# Patient Record
Sex: Male | Born: 1970 | Race: Black or African American | Hispanic: No | Marital: Single | Smoking: Current every day smoker
Health system: Southern US, Community
[De-identification: ages and names within clinical notes are randomized; demographics above are authoritative.]

## PROBLEM LIST (undated history)

## (undated) ENCOUNTER — Emergency Department (HOSPITAL_COMMUNITY): Admission: EM | Payer: Self-pay

## (undated) HISTORY — PX: SURGERY SCROTAL / TESTICULAR: SUR1316

---

## 2014-06-20 ENCOUNTER — Encounter (HOSPITAL_COMMUNITY): Payer: Self-pay

## 2014-06-20 ENCOUNTER — Emergency Department (HOSPITAL_COMMUNITY)
Admission: EM | Admit: 2014-06-20 | Discharge: 2014-06-20 | Disposition: A | Discharge status: Home / Self Care | Payer: Self-pay | Attending: Emergency Medicine | Admitting: Emergency Medicine

## 2014-06-20 DIAGNOSIS — Z720 Tobacco use: Secondary | ICD-10-CM | POA: Insufficient documentation

## 2014-06-20 DIAGNOSIS — Z711 Person with feared health complaint in whom no diagnosis is made: Secondary | ICD-10-CM

## 2014-06-20 DIAGNOSIS — Z202 Contact with and (suspected) exposure to infections with a predominantly sexual mode of transmission: Secondary | ICD-10-CM | POA: Insufficient documentation

## 2014-06-20 DIAGNOSIS — R300 Dysuria: Secondary | ICD-10-CM | POA: Insufficient documentation

## 2014-06-20 MED ORDER — CEFTRIAXONE SODIUM 250 MG IJ SOLR
250.0000 mg | Freq: Once | INTRAMUSCULAR | Status: AC
  Administered 2014-06-20: 250 mg via INTRAMUSCULAR
  Filled 2014-06-20: qty 250

## 2014-06-20 MED ORDER — AZITHROMYCIN 250 MG PO TABS
1000.0000 mg | ORAL_TABLET | Freq: Once | ORAL | Status: AC
  Administered 2014-06-20: 1000 mg via ORAL
  Filled 2014-06-20: qty 4

## 2014-06-20 MED ORDER — LIDOCAINE HCL (PF) 1 % IJ SOLN
2.0000 mL | Freq: Once | INTRAMUSCULAR | Status: AC
  Administered 2014-06-20: 2 mL via INTRADERMAL
  Filled 2014-06-20: qty 5

## 2014-06-20 NOTE — ED Notes (Signed)
Pt. Reports wanting to be checked for an STD. States he is having "discomfort. Something is not right down there". Denies discharge, rash, swelling.

## 2014-06-20 NOTE — Discharge Instructions (Signed)
Refrain from sexual intercourse for 7 days. Be sure to have all partners tested and treated for STDs.  This may be performed by your primary care provider or the health department.  Practice safe sex by always using condoms.  ° °

## 2014-06-20 NOTE — ED Notes (Signed)
PA at the bedside.

## 2014-06-20 NOTE — ED Notes (Signed)
Pt A7Ox4, ambulatory at d/c with steady gait, NAD

## 2014-06-20 NOTE — ED Provider Notes (Signed)
CSN: [id number]     Arrival date & time 06/20/14  2044 History  This chart was scribed for Junius Finner, PA-C, working with Juliet Rude. Rubin Payor, MD by Chestine Spore, ED Scribe. The patient was seen in room TR11C/TR11C at 9:34 PM.    Chief Complaint  Patient presents with  . Exposure to STD      The history is provided by the patient. No language interpreter was used.    HPI Comments: Rodney [name] is a 44 y.o. male who presents to the Emergency Department complaining of exposure to STD onset 2 days. He states that he is having associated symptoms of abdominal pain. Pt rates his abdominal pain is 7/10. Pt has been treated for STDs in the past. He states that he has not tried any medications for the relief of his symptoms. He denies penile discharge, rash, penile swelling, dysuria, and any other symptoms. Denies allergies to medications. Pt has concern for syphilis but not HIV.   History reviewed. No pertinent past medical history. History reviewed. No pertinent past surgical history. No family history on file. History  Substance Use Topics  . Smoking status: Current Every Day Smoker    Types: Cigarettes  . Smokeless tobacco: Not on file  . Alcohol Use: No    Review of Systems  Gastrointestinal: Positive for abdominal pain.  Genitourinary: Negative for discharge and penile swelling.      Allergies  Review of patient's allergies indicates no known allergies.  Home Medications   Prior to Admission medications   Not on File   BP 117/62 mmHg  Pulse 52  Temp(Src) 97.7 F (36.5 C) (Oral)  Resp 14  SpO2 100% Physical Exam  Constitutional: He is oriented to person, place, and time. He appears well-developed and well-nourished.  HENT:  Head: Normocephalic and atraumatic.  Eyes: EOM are normal.  Neck: Normal range of motion.  Cardiovascular: Normal rate.   Pulmonary/Chest: Effort normal.  Genitourinary: Testes normal and penis normal. Right testis shows no mass and no  tenderness. Left testis shows no mass and no tenderness. Uncircumcised. No penile erythema. No discharge found.  chaperoned exam.   Musculoskeletal: Normal range of motion.  Neurological: He is alert and oriented to person, place, and time.  Skin: Skin is warm and dry.  Psychiatric: He has a normal mood and affect. His behavior is normal.  Nursing note and vitals reviewed.   ED Course  Procedures (including critical care time) DIAGNOSTIC STUDIES: Oxygen Saturation is 99% on room air, normal by my interpretation.    COORDINATION OF CARE: 9:37 PM-Discussed assessment and treatment plan which includes testing HIV antibody, RPR,  GC/Chlamydia probe tx with rocephin, zithromax, with pt at bedside and pt agreed to plan.   Labs Review Labs Reviewed  HIV ANTIBODY (ROUTINE TESTING)  RPR  GC/CHLAMYDIA PROBE AMP ([place])    Imaging Review No results found.   EKG Interpretation None      MDM   Final diagnoses:  Concern about STD in male without diagnosis  Dysuria    Pt presenting to ED with concern for STD.  STD tests performed in ED, will tx empirically. Home care instructions provided. Advised to be rechecked in 7 days and have all partners tested and treated. Return precautions provided. Pt verbalized understanding and agreement with tx plan.   I personally performed the services described in this documentation, which was scribed in my presence. The recorded information has been reviewed and is accurate.    Denny Peon  Gershon MusselO'Malley, PA-C 06/20/14 2234  Juliet RudeNathan R. Rubin PayorPickering, MD 06/21/14 [phone]

## 2014-06-21 LAB — GC/CHLAMYDIA PROBE AMP (~~LOC~~) NOT AT ARMC
Chlamydia: NEGATIVE
Neisseria Gonorrhea: NEGATIVE

## 2014-06-21 LAB — HIV ANTIBODY (ROUTINE TESTING W REFLEX): HIV Screen 4th Generation wRfx: NONREACTIVE

## 2014-06-21 LAB — RPR: RPR Ser Ql: NONREACTIVE

## 2014-07-18 ENCOUNTER — Emergency Department (HOSPITAL_COMMUNITY)
Admission: EM | Admit: 2014-07-18 | Discharge: 2014-07-18 | Disposition: A | Discharge status: Home / Self Care | Payer: Self-pay | Attending: Emergency Medicine | Admitting: Emergency Medicine

## 2014-07-18 ENCOUNTER — Emergency Department (HOSPITAL_COMMUNITY): Payer: Self-pay

## 2014-07-18 ENCOUNTER — Encounter (HOSPITAL_COMMUNITY): Payer: Self-pay | Admitting: Emergency Medicine

## 2014-07-18 DIAGNOSIS — Y9389 Activity, other specified: Secondary | ICD-10-CM | POA: Insufficient documentation

## 2014-07-18 DIAGNOSIS — M545 Low back pain, unspecified: Secondary | ICD-10-CM

## 2014-07-18 DIAGNOSIS — S3992XA Unspecified injury of lower back, initial encounter: Secondary | ICD-10-CM | POA: Insufficient documentation

## 2014-07-18 DIAGNOSIS — Z720 Tobacco use: Secondary | ICD-10-CM | POA: Insufficient documentation

## 2014-07-18 DIAGNOSIS — Y92410 Unspecified street and highway as the place of occurrence of the external cause: Secondary | ICD-10-CM | POA: Insufficient documentation

## 2014-07-18 DIAGNOSIS — S199XXA Unspecified injury of neck, initial encounter: Secondary | ICD-10-CM | POA: Insufficient documentation

## 2014-07-18 DIAGNOSIS — M549 Dorsalgia, unspecified: Secondary | ICD-10-CM

## 2014-07-18 DIAGNOSIS — Y998 Other external cause status: Secondary | ICD-10-CM | POA: Insufficient documentation

## 2014-07-18 DIAGNOSIS — S0990XA Unspecified injury of head, initial encounter: Secondary | ICD-10-CM | POA: Insufficient documentation

## 2014-07-18 MED ORDER — HYDROCODONE-ACETAMINOPHEN 5-325 MG PO TABS
2.0000 | ORAL_TABLET | Freq: Once | ORAL | Status: AC
  Administered 2014-07-18: 2 via ORAL
  Filled 2014-07-18: qty 2

## 2014-07-18 MED ORDER — TRAMADOL HCL 50 MG PO TABS: 50.0000 mg | ORAL_TABLET | Freq: Four times a day (QID) | ORAL | Status: DC | PRN

## 2014-07-18 MED ORDER — NAPROXEN 500 MG PO TABS: 500.0000 mg | ORAL_TABLET | Freq: Two times a day (BID) | ORAL | Status: DC

## 2014-07-18 MED ORDER — METHOCARBAMOL 500 MG PO TABS: 500.0000 mg | ORAL_TABLET | Freq: Two times a day (BID) | ORAL | Status: DC
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
## 2016-04-09 ENCOUNTER — Encounter (HOSPITAL_COMMUNITY): Payer: Self-pay

## 2016-04-09 ENCOUNTER — Emergency Department (HOSPITAL_COMMUNITY): Payer: Self-pay

## 2016-04-09 ENCOUNTER — Emergency Department (HOSPITAL_COMMUNITY)
Admission: EM | Admit: 2016-04-09 | Discharge: 2016-04-10 | Disposition: A | Discharge status: Home / Self Care | Payer: Self-pay | Attending: Emergency Medicine | Admitting: Emergency Medicine

## 2016-04-09 DIAGNOSIS — F17210 Nicotine dependence, cigarettes, uncomplicated: Secondary | ICD-10-CM | POA: Insufficient documentation

## 2016-04-09 DIAGNOSIS — M25511 Pain in right shoulder: Secondary | ICD-10-CM | POA: Insufficient documentation

## 2016-04-09 MED ORDER — DEXAMETHASONE SODIUM PHOSPHATE 10 MG/ML IJ SOLN
10.0000 mg | Freq: Once | INTRAMUSCULAR | Status: AC
  Administered 2016-04-09: 10 mg via INTRAMUSCULAR
  Filled 2016-04-09: qty 1

## 2016-04-09 MED ORDER — KETOROLAC TROMETHAMINE 60 MG/2ML IM SOLN
60.0000 mg | Freq: Once | INTRAMUSCULAR | Status: AC
  Administered 2016-04-09: 60 mg via INTRAMUSCULAR
  Filled 2016-04-09: qty 2

## 2016-04-09 MED ORDER — NAPROXEN 500 MG PO TABS: 500.0000 mg | ORAL_TABLET | Freq: Two times a day (BID) | ORAL | 0 refills | Status: DC

## 2016-04-09 NOTE — ED Triage Notes (Addendum)
Pt endorses waking up with right shoulder pain this morning radiating into right side of neck. Right shoulder pain worse with limited movement. CMS intact and neuro intact in right arm. Denies CP and cardiac sx.

## 2016-04-09 NOTE — Discharge Instructions (Signed)
Your xray today showed a small bony fragment that is likely the cause of your pain.  It is causing inflammation of the tendons in your shoulder.  Start taking Naproxen twice daily for pain.  Please follow up with

## 2016-04-09 NOTE — ED Provider Notes (Signed)
[facility]-EMERGENCY DEPT Provider Note   CSN: [id number] Arrival date & time: 04/09/16  2221  By signing my name below, I, Soijett Blue, attest that this documentation has been prepared under the direction and in the presence of Cheri FowlerKayla Skipper Dacosta, PA-C Electronically Signed: Soijett Blue, ED Scribe. 04/09/16. 11:09 PM.  History   Chief Complaint Chief Complaint  Patient presents with  . Shoulder Pain    HPI Rodney [name] is a 45 y.o. male who presents to the Emergency Department complaining of right shoulder pain onset 12 PM today. Pt notes that he was laying down when his right shoulder pain occurred. Pt right shoulder pain radiates to his right sided neck and deltoid. Denies recent fall or injury. He has not tried any medications for the relief of his symptoms. He denies numbness, CP, SOB, color change, wound, and any other symptoms. Pt states that he works in [employer].    The history is provided by the patient. No language interpreter was used.    History reviewed. No pertinent past medical history.  There are no active problems to display for this patient.   History reviewed. No pertinent surgical history.     Home Medications    Prior to Admission medications   Medication Sig Start Date End Date Taking? Authorizing Provider  methocarbamol (ROBAXIN) 500 MG tablet Take 1 tablet (500 mg total) by mouth 2 (two) times daily. 07/18/14   Heather Laisure, PA-C  naproxen (NAPROSYN) 500 MG tablet Take 1 tablet (500 mg total) by mouth 2 (two) times daily. 04/09/16   Cheri FowlerKayla Loyd [place], PA-C  traMADol (ULTRAM) 50 MG tablet Take 1 tablet (50 mg total) by mouth every 6 (six) hours as needed. 07/18/14   Santiago GladHeather Laisure, PA-C    Family History History reviewed. No pertinent family history.  Social History Social History  Substance Use Topics  . Smoking status: Current Every Day Smoker    Types: Cigarettes  . Smokeless tobacco: Never Used  . Alcohol use No     Allergies   Patient has no  known allergies.   Review of Systems Review of Systems  Respiratory: Negative for shortness of breath.   Cardiovascular: Negative for chest pain.  Musculoskeletal: Positive for arthralgias (right shoulder) and neck pain (right sided). Negative for joint swelling.  Skin: Negative for color change and wound.     Physical Exam Updated Vital Signs BP 141/77 (BP Location: Left Arm)   Pulse 64   Temp 98 F (36.7 C) (Oral)   Resp 16   Ht 5\' 11"  (1.803 m)   Wt 77.6 kg   SpO2 100%   BMI 23.85 kg/m   Physical Exam  Constitutional: He is oriented to person, place, and time. He appears well-developed and well-nourished.  Patient sitting in the chair with the blanket over his head facing the wall.  HENT:  Head: Normocephalic and atraumatic.  Right Ear: External ear normal.  Left Ear: External ear normal.  Eyes: Conjunctivae are normal. No scleral icterus.  Neck: No tracheal deviation present.  Cardiovascular:  Pulses:      Radial pulses are 2+ on the right side, and 2+ on the left side.  Pulmonary/Chest: Effort normal. No respiratory distress.  Abdominal: He exhibits no distension.  Musculoskeletal: Normal range of motion.       Right shoulder: He exhibits tenderness.  Exquisite tenderness over right AC joint. No overlying warmth or erythema. No bruising. Pt refused ROM exercise. Pain with resisted internal and external rotation.   Neurological:  He is alert and oriented to person, place, and time. He has normal strength. No sensory deficit.  Strength and sensation intact.  Skin: Skin is warm and dry.  Psychiatric: He has a normal mood and affect. His behavior is normal.  Nursing note and vitals reviewed.    ED Treatments / Results  DIAGNOSTIC STUDIES: Oxygen Saturation is 100% on RA, nl by my interpretation.    COORDINATION OF CARE: 11:08 PM Discussed treatment plan with pt at bedside which includes toradol injection, decadron injection, naprosyn Rx, referral and follow up  with orthopedist, and pt agreed to plan.  Radiology Dg Shoulder Right  Result Date: 04/09/2016 CLINICAL DATA:  45 year old male with right shoulder pain. No known injury. EXAM: RIGHT SHOULDER - 2+ VIEW COMPARISON:  None. FINDINGS: There is no acute fracture or dislocation. The bones are well mineralized. 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 The visualized lungs are clear. The soft tissues appear unremarkable. IMPRESSION: No acute fracture or dislocation. Electronically Signed   By: Elgie CollardArash  Radparvar M.D.   On: 04/09/2016 23:01    Procedures Procedures (including critical care time)  Medications Ordered in ED Medications  dexamethasone (DECADRON) injection 10 mg (10 mg Intramuscular Given 04/09/16 2347)  ketorolac (TORADOL) injection 60 mg (60 mg Intramuscular Given 04/09/16 2347)     Initial Impression / Assessment and Plan / ED Course  I have reviewed the triage vital signs and the nursing notes.  Pertinent imaging results that were available during my care of the patient were reviewed by me and considered in my medical decision making (see chart for details).  Clinical Course    Patient X-Ray negative for obvious fracture or dislocation.  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 Pt advised to follow up with orthopedics. Patient given decadron injection and toradol injection while in the ED. Pt will be discharged home with naprosyn Rx. Conservative therapy recommended and discussed. Patient will be discharged home & is agreeable with above plan. Returns precautions discussed. Pt appears safe for discharge.  Final Clinical Impressions(s) / ED Diagnoses   Final diagnoses:  Acute pain of right shoulder    New Prescriptions New Prescriptions   NAPROXEN (NAPROSYN) 500 MG TABLET    Take 1 tablet (500 mg total) by mouth 2 (two) times daily.   I  personally performed the services described in this documentation, which was scribed in my presence. The recorded information has been reviewed and is accurate.     Cheri FowlerKayla Cordero Surette, PA-C 04/09/16 2348    Geoffery Lyonsouglas Delo, MD 04/11/16 2114

## 2016-04-11 ENCOUNTER — Encounter (HOSPITAL_COMMUNITY): Payer: Self-pay | Admitting: Emergency Medicine

## 2016-04-11 ENCOUNTER — Emergency Department (HOSPITAL_COMMUNITY)
Admission: EM | Admit: 2016-04-11 | Discharge: 2016-04-11 | Disposition: A | Discharge status: Home / Self Care | Payer: Self-pay | Attending: Emergency Medicine | Admitting: Emergency Medicine

## 2016-04-11 DIAGNOSIS — M75121 Complete rotator cuff tear or rupture of right shoulder, not specified as traumatic: Secondary | ICD-10-CM | POA: Insufficient documentation

## 2016-04-11 DIAGNOSIS — M12811 Other specific arthropathies, not elsewhere classified, right shoulder: Secondary | ICD-10-CM

## 2016-04-11 DIAGNOSIS — F17210 Nicotine dependence, cigarettes, uncomplicated: Secondary | ICD-10-CM | POA: Insufficient documentation

## 2016-04-11 MED ORDER — OXYCODONE-ACETAMINOPHEN 5-325 MG PO TABS
1.0000 | ORAL_TABLET | Freq: Once | ORAL | Status: AC
  Administered 2016-04-11: 1 via ORAL
  Filled 2016-04-11: qty 1

## 2016-04-11 MED ORDER — DICLOFENAC POTASSIUM 50 MG PO TABS: 50.0000 mg | ORAL_TABLET | Freq: Two times a day (BID) | ORAL | 0 refills | Status: DC

## 2016-04-11 MED ORDER — OXYCODONE-ACETAMINOPHEN 5-325 MG PO TABS: 1.0000 | ORAL_TABLET | ORAL | 0 refills | Status: DC | PRN

## 2016-04-11 NOTE — ED Provider Notes (Signed)
[facility]-EMERGENCY DEPT Provider Note   CSN: [id number] Arrival date & time: 04/11/16  0530     History   Chief Complaint Chief Complaint  Patient presents with  . Shoulder Pain    HPI Rodney [name] is a 45 y.o. male.  He comes in with ongoing pain in his right shoulder. Pain is been present for 2 days. Pain is severe and he rates it at 10/10. He has been unable to sleep. Pain is worse with any movement. 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 He was seen in the ED and prescribed naproxen but it is not helping. He denies weakness, numbness, tingling. Pain does radiate up towards his neck but not into his arm.   The history is provided by the patient.  Shoulder Pain      History reviewed. No pertinent past medical history.  There are no active problems to display for this patient.   History reviewed. No pertinent surgical history.     Home Medications    Prior to Admission medications   Medication Sig Start Date End Date Taking? Authorizing Provider  diclofenac (CATAFLAM) 50 MG tablet Take 1 tablet (50 mg total) by mouth 2 (two) times daily. 04/11/16   Dione Boozeavid Jjesus Dingley, MD  methocarbamol (ROBAXIN) 500 MG tablet Take 1 tablet (500 mg total) by mouth 2 (two) times daily. 07/18/14   Heather Laisure, PA-C  naproxen (NAPROSYN) 500 MG tablet Take 1 tablet (500 mg total) by mouth 2 (two) times daily. 04/09/16   Cheri FowlerKayla Rose, PA-C  oxyCODONE-acetaminophen (PERCOCET/ROXICET) 5-325 MG tablet Take 1 tablet by mouth every 4 (four) hours as needed for severe pain. 04/11/16   Dione Boozeavid Chiquitta Matty, MD  traMADol (ULTRAM) 50 MG tablet Take 1 tablet (50 mg total) by mouth every 6 (six) hours as needed. 07/18/14   Santiago GladHeather Laisure, PA-C    Family History No family history on file.  Social History Social History  Substance Use Topics  . Smoking status: Current Every Day Smoker    Types: Cigarettes  . Smokeless tobacco: Never Used  . Alcohol use No     Allergies   Patient  has no known allergies.   Review of Systems Review of Systems  All other systems reviewed and are negative.    Physical Exam Updated Vital Signs BP 121/80 (BP Location: Right Arm)   Pulse 72   Temp 98.3 F (36.8 C) (Oral)   Resp 22   Ht 5\' 11"  (1.803 m)   Wt 174 lb (78.9 kg)   SpO2 99%   BMI 24.27 kg/m   Physical Exam  Nursing note and vitals reviewed.  45 year old male, Appears uncomfortable, but is in no acute distress. Vital signs are ormal. Oxygen saturation is 99%, which isn normal. Head is normocephalic and atraumatic. PERRLA, EOMI. Oropharynx is clear. Neck is nontender and supple without adenopathy or JVD. Back is nontender and there is no CVA tenderness. Lungs are clear without rales, wheezes, or rhonchi. Chest is nontender. Heart has regular rate and rhythm without murmur. Abdomen is soft, flat, nontender without masses or hepatosplenomegaly and peristalsis is normoactive. Extremities: There is tenderness over the superior and proximal portions of the right shoulder with significant tenderness in the anterior deltoid groove. Rotator cuff impingement signs are present.. Skin is warm and dry without rash. Neurologic: Mental status is normal, cranial nerves are intact, there are no motor or sensory deficits.  ED Treatments / Results   Radiology Dg Shoulder Right  Result Date: 04/09/2016 CLINICAL DATA:  45 year old male with right shoulder pain. No known injury. EXAM: RIGHT SHOULDER - 2+ VIEW COMPARISON:  None. FINDINGS: There is no acute fracture or dislocation. The bones are well mineralized. Small bone fragment superior to the [facility] appears chronic and likely related to chronic tendinopathy. The visualized lungs are clear. The soft tissues appear unremarkable. IMPRESSION: No acute fracture or dislocation. Electronically Signed   By: Elgie CollardArash  Radparvar M.D.   On: 04/09/2016 23:01    Procedures Procedures (including critical care time)  Medications Ordered in  ED Medications  oxyCODONE-acetaminophen (PERCOCET/ROXICET) 5-325 MG per tablet 1 tablet (not administered)     Initial Impression / Assessment and Plan / ED Course  I have reviewed the triage vital signs and the nursing notes.  Pertinent imaging results that were available during my care of the patient were reviewed by me and considered in my medical decision making (see chart for details).  Clinical Course    Right shoulder pain which seems most consistent with rotator cuff injury. Old records are reviewed, and he did have x-rays done of his shoulder which did show a bone fragment which might be part of tendinitis. However, clinically his father and his rotator cuff injury. He is given a sling for comfort and will give trial of a stronger NSAID of diclofenac. Given prescription for oxycodone have acetaminophen for severe pain. He is given a referral to orthopedics.  Final Clinical Impressions(s) / ED Diagnoses   Final diagnoses:  Rotator cuff arthropathy, right    New Prescriptions New Prescriptions   DICLOFENAC (CATAFLAM) 50 MG TABLET    Take 1 tablet (50 mg total) by mouth 2 (two) times daily.   OXYCODONE-ACETAMINOPHEN (PERCOCET/ROXICET) 5-325 MG TABLET    Take 1 tablet by mouth every 4 (four) hours as needed for severe pain.     Dione Boozeavid Purl Claytor, MD 04/11/16 [phone]

## 2016-04-11 NOTE — ED Triage Notes (Addendum)
t Pt arrives with c/o of R shoulder pain ongoing this week, reports woke up with it. CMS intact. Seen earlier this week and doesn't think naproxen RX was strong enough. See PA-C note from last visit.

## 2016-04-11 NOTE — Discharge Instructions (Signed)
Apply ice several times a day. Put your shoulder through a full range of motion twice a day.

## 2017-05-20 ENCOUNTER — Emergency Department (HOSPITAL_COMMUNITY)
Admission: EM | Admit: 2017-05-20 | Discharge: 2017-05-20 | Disposition: A | Discharge status: Home / Self Care | Payer: Self-pay | Attending: Emergency Medicine | Admitting: Emergency Medicine

## 2017-05-20 ENCOUNTER — Encounter (HOSPITAL_COMMUNITY): Payer: Self-pay | Admitting: Emergency Medicine

## 2017-05-20 ENCOUNTER — Other Ambulatory Visit: Payer: Self-pay

## 2017-05-20 DIAGNOSIS — R1032 Left lower quadrant pain: Secondary | ICD-10-CM

## 2017-05-20 DIAGNOSIS — Z79899 Other long term (current) drug therapy: Secondary | ICD-10-CM | POA: Insufficient documentation

## 2017-05-20 DIAGNOSIS — R590 Localized enlarged lymph nodes: Secondary | ICD-10-CM | POA: Insufficient documentation

## 2017-05-20 DIAGNOSIS — F17210 Nicotine dependence, cigarettes, uncomplicated: Secondary | ICD-10-CM | POA: Insufficient documentation

## 2017-05-20 LAB — URINALYSIS, ROUTINE W REFLEX MICROSCOPIC
Bilirubin Urine: NEGATIVE
Glucose, UA: NEGATIVE mg/dL
Hgb urine dipstick: NEGATIVE
Ketones, ur: NEGATIVE mg/dL
Leukocytes, UA: NEGATIVE
Nitrite: NEGATIVE
Protein, ur: NEGATIVE mg/dL
SPECIFIC GRAVITY, URINE: 1.013 (ref 1.005–1.030)
pH: 6 (ref 5.0–8.0)

## 2017-05-20 LAB — COMPREHENSIVE METABOLIC PANEL
ALBUMIN: 4.1 g/dL (ref 3.5–5.0)
ALT: 11 U/L — ABNORMAL LOW (ref 17–63)
ANION GAP: 10 (ref 5–15)
AST: 18 U/L (ref 15–41)
Alkaline Phosphatase: 50 U/L (ref 38–126)
BUN: 8 mg/dL (ref 6–20)
CO2: 25 mmol/L (ref 22–32)
Calcium: 9.1 mg/dL (ref 8.9–10.3)
Chloride: 103 mmol/L (ref 101–111)
Creatinine, Ser: 1.18 mg/dL (ref 0.61–1.24)
GFR calc non Af Amer: >60 mL/min (ref >60)
Glucose, Bld: 101 mg/dL — ABNORMAL HIGH (ref 65–99)
Potassium: 4.3 mmol/L (ref 3.5–5.1)
SODIUM: 138 mmol/L (ref 135–145)
Total Bilirubin: 0.5 mg/dL (ref 0.3–1.2)
Total Protein: 6.8 g/dL (ref 6.5–8.1)

## 2017-05-20 LAB — CBC
HCT: 48.4 % (ref 39.0–52.0)
Hemoglobin: 16.8 g/dL (ref 13.0–17.0)
MCH: 31.9 pg (ref 26.0–34.0)
MCHC: 34.7 g/dL (ref 30.0–36.0)
MCV: 92 fL (ref 78.0–100.0)
Platelets: 122 10*3/uL — ABNORMAL LOW (ref 150–400)
RBC: 5.26 MIL/uL (ref 4.22–5.81)
RDW: 12.8 % (ref 11.5–15.5)
WBC: 4 10*3/uL (ref 4.0–10.5)

## 2017-05-20 LAB — LIPASE, BLOOD: LIPASE: 34 U/L (ref 11–51)

## 2017-05-20 NOTE — ED Provider Notes (Signed)
[facility] EMERGENCY DEPARTMENT Provider Note   CSN: [id number] Arrival date & time: 05/20/17  1006     History   Chief Complaint Chief Complaint  Patient presents with  . Abdominal Pain    HPI Rodney [name] is a 47 y.o. male.  HPI YOUNIS MATHEY is a 47 y.o. male presents to ED with complaint of lower abdominal pain. Pt states he feels a 'knot" in left groin. Pain is constant, dull, achy. Pt stats pain started 3days ago.  Reports some dysuria. Denies testicular pain. Denies hematuria, urinary frequency or urgency. Denies nausea or vomiting. Has had some diarrhea. No fever or chills. Denies any injuries.  Nothing is making his pain better or worse.  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   History reviewed. No pertinent past medical history.  There are no active problems to display for this patient.   Past Surgical History:  Procedure Laterality Date  . SURGERY SCROTAL / TESTICULAR Right        Home Medications    Prior to Admission medications   Medication Sig Start Date End Date Taking? Authorizing Provider  diclofenac (CATAFLAM) 50 MG tablet Take 1 tablet (50 mg total) by mouth 2 (two) times daily. 04/11/16   Dione Booze, MD  methocarbamol (ROBAXIN) 500 MG tablet Take 1 tablet (500 mg total) by mouth 2 (two) times daily. 07/18/14   Santiago Glad, PA-C  naproxen (NAPROSYN) 500 MG tablet Take 1 tablet (500 mg total) by mouth 2 (two) times daily. 04/09/16   Cheri Fowler, PA-C  oxyCODONE-acetaminophen (PERCOCET/ROXICET) 5-325 MG tablet Take 1 tablet by mouth every 4 (four) hours as needed for severe pain. 04/11/16   Dione Booze, MD  traMADol (ULTRAM) 50 MG tablet Take 1 tablet (50 mg total) by mouth every 6 (six) hours as needed. 07/18/14   Santiago Glad, PA-C    Family History No family history on file.  Social History Social History   Tobacco Use  . Smoking status: Current Every Day Smoker    Types: Cigarettes  . Smokeless tobacco:  Never Used  Substance Use Topics  . Alcohol use: No  . Drug use: Yes    Types: Marijuana    Comment: occ     Allergies   Patient has no known allergies.   Review of Systems Review of Systems  Constitutional: Negative for chills and fever.  Respiratory: Negative for cough, chest tightness and shortness of breath.   Cardiovascular: Negative for chest pain, palpitations and leg swelling.  Gastrointestinal: Positive for abdominal pain and diarrhea. Negative for abdominal distention, nausea and vomiting.  Genitourinary: Positive for dysuria. Negative for difficulty urinating, discharge, frequency, hematuria, penile pain, penile swelling and urgency.  Musculoskeletal: Negative for arthralgias, myalgias, neck pain and neck stiffness.  Skin: Negative for rash.  Allergic/Immunologic: Negative for immunocompromised state.  Neurological: Negative for dizziness, weakness, light-headedness, numbness and headaches.  All other systems reviewed and are negative.    Physical Exam Updated Vital Signs BP 136/81 (BP Location: Right Arm)   Pulse 81   Temp 98.4 F (36.9 C) (Oral)   Resp (!) 24   Ht 5\' 11"  (1.803 m)   Wt 77.1 kg (170 lb)   SpO2 100%   BMI 23.71 kg/m   Physical Exam  Constitutional: He appears well-developed and well-nourished. No distress.  HENT:  Head: Normocephalic and atraumatic.  Eyes: Conjunctivae are normal.  Neck: Neck supple.  Cardiovascular: Normal rate, regular rhythm and normal heart sounds.  Pulmonary/Chest: Effort normal. No respiratory distress. He has no wheezes. He has no rales.  Abdominal: Soft. Bowel sounds are normal. He exhibits no distension. There is tenderness. There is no rebound.  Mild tenderness in left groin with no palpable hernias.  Slightly enlarged and tender lymphadenopathy.  No abdominal tenderness otherwise.  Genitourinary: Penis normal.  Genitourinary Comments: Normal penis.  Right scrotum absent.  Left scrotum is nontender, cremasteric  reflex present.  No palpable hernias.  Musculoskeletal: He exhibits no edema.  Neurological: He is alert.  Skin: Skin is warm and dry.  Nursing note and vitals reviewed.    ED Treatments / Results  Labs (all labs ordered are listed, but only abnormal results are displayed) Labs Reviewed  CBC - Abnormal; Notable for the following components:      Result Value   Platelets 122 (*)    All other components within normal limits  LIPASE, BLOOD  COMPREHENSIVE METABOLIC PANEL  URINALYSIS, ROUTINE W REFLEX MICROSCOPIC    EKG  EKG Interpretation None       Radiology No results found.  Procedures Procedures (including critical care time)  Medications Ordered in ED Medications - No data to display   Initial Impression / Assessment and Plan / ED Course  I have reviewed the triage vital signs and the nursing notes.  Pertinent labs & imaging results that were available during my care of the patient were reviewed by me and considered in my medical decision making (see chart for details).     Patient in emergency department with left sided groin pain.  Palpable tender lymph nodes noted on exam.  No abdominal tenderness otherwise.  Unremarkable scrotum and penis.  I did swab him for gonorrhea chlamydia, will add RPR and HIV.  Labs and urinalysis pending.  1:02 PM Labs and UA negative. No scrotal pain or tenderness. No palpable hernias. Most likely pain with inguinal lymphadenopathy. Not sure exact cause for this, GC/chlamydia, rpr, hiv sent and pending.  At this time, patient is not vomiting, having normal bowel movements, has normal vital signs, has normal labs, he stable for discharge home with close outpatient follow-up.  Discussed return precautions in case his symptoms are worsening.  Discussed pending tests.  Patient agrees with the plan, he will follow follow-up outpatient  Vitals:   05/20/17 1045 05/20/17 1049  BP: 136/81   Pulse: 81   Resp: (!) 24   Temp: 98.4 F (36.9  C)   TempSrc: Oral   SpO2: 100%   Weight:  77.1 kg (170 lb)  Height:  5\' 11"  (1.803 m)     Final Clinical Impressions(s) / ED Diagnoses   Final diagnoses:  Left lower quadrant pain  Lymphadenopathy, inguinal    ED Discharge Orders    None       Jaynie CrumbleKirichenko, Jayveion Stalling, Cordelia Poche-C 05/20/17 2024    Nira Connardama, Pedro Eduardo, MD 05/20/17 2047

## 2017-05-20 NOTE — Discharge Instructions (Signed)
Take ibuprofen or Tylenol for pain.  Your gonorrhea, chlamydia, syphilis, HIV tests are pending and if they come back abnormal we will give you call.  Please follow-up with family doctor.  Return if worsening symptoms

## 2017-05-20 NOTE — ED Triage Notes (Signed)
Pt c/o abd pain x 2 days-- with "knot in groin area" -- no vomiting, diarrhea yesterday, burns sometimes with urination-- denies any discharge.

## 2017-05-20 NOTE — ED Notes (Signed)
Pt giving urine sample at this time.

## 2017-05-21 LAB — RPR: RPR: NONREACTIVE

## 2017-05-21 LAB — HIV ANTIBODY (ROUTINE TESTING W REFLEX): HIV Screen 4th Generation wRfx: NONREACTIVE

## 2017-05-21 LAB — GC/CHLAMYDIA PROBE AMP (~~LOC~~) NOT AT ARMC
CHLAMYDIA, DNA PROBE: NEGATIVE
Neisseria Gonorrhea: NEGATIVE

## 2017-05-26 ENCOUNTER — Emergency Department (HOSPITAL_COMMUNITY): Admission: EM | Admit: 2017-05-26 | Discharge: 2017-05-26 | Discharge status: Against Medical Advice | Payer: Self-pay

## 2017-05-26 NOTE — ED Triage Notes (Signed)
Pt called, registration states the [place] [employer] [place].

## 2017-05-29 ENCOUNTER — Emergency Department (HOSPITAL_COMMUNITY)
Admission: EM | Admit: 2017-05-29 | Discharge: 2017-05-29 | Disposition: A | Discharge status: Home / Self Care | Payer: Self-pay | Attending: Emergency Medicine | Admitting: Emergency Medicine

## 2017-05-29 ENCOUNTER — Emergency Department (HOSPITAL_BASED_OUTPATIENT_CLINIC_OR_DEPARTMENT_OTHER): Payer: Self-pay

## 2017-05-29 ENCOUNTER — Emergency Department (HOSPITAL_BASED_OUTPATIENT_CLINIC_OR_DEPARTMENT_OTHER)
Admission: EM | Admit: 2017-05-29 | Discharge: 2017-05-29 | Disposition: A | Discharge status: Home / Self Care | Payer: Self-pay | Attending: Emergency Medicine | Admitting: Emergency Medicine

## 2017-05-29 ENCOUNTER — Encounter (HOSPITAL_COMMUNITY): Payer: Self-pay | Admitting: Emergency Medicine

## 2017-05-29 ENCOUNTER — Other Ambulatory Visit: Payer: Self-pay

## 2017-05-29 ENCOUNTER — Encounter (HOSPITAL_BASED_OUTPATIENT_CLINIC_OR_DEPARTMENT_OTHER): Payer: Self-pay | Admitting: Emergency Medicine

## 2017-05-29 DIAGNOSIS — R319 Hematuria, unspecified: Secondary | ICD-10-CM | POA: Insufficient documentation

## 2017-05-29 DIAGNOSIS — F17210 Nicotine dependence, cigarettes, uncomplicated: Secondary | ICD-10-CM | POA: Insufficient documentation

## 2017-05-29 DIAGNOSIS — N3091 Cystitis, unspecified with hematuria: Secondary | ICD-10-CM | POA: Insufficient documentation

## 2017-05-29 DIAGNOSIS — R109 Unspecified abdominal pain: Secondary | ICD-10-CM | POA: Insufficient documentation

## 2017-05-29 DIAGNOSIS — R300 Dysuria: Secondary | ICD-10-CM | POA: Insufficient documentation

## 2017-05-29 DIAGNOSIS — Z5321 Procedure and treatment not carried out due to patient leaving prior to being seen by health care provider: Secondary | ICD-10-CM | POA: Insufficient documentation

## 2017-05-29 LAB — URINALYSIS, ROUTINE W REFLEX MICROSCOPIC
BACTERIA UA: NONE SEEN
Bilirubin Urine: NEGATIVE
Glucose, UA: NEGATIVE mg/dL
Ketones, ur: NEGATIVE mg/dL
Leukocytes, UA: NEGATIVE
NITRITE: NEGATIVE
Protein, ur: 30 mg/dL — AB
SPECIFIC GRAVITY, URINE: 1.025 (ref 1.005–1.030)
Squamous Epithelial / LPF: NONE SEEN
pH: 6 (ref 5.0–8.0)

## 2017-05-29 MED ORDER — CIPROFLOXACIN HCL 500 MG PO TABS: 500.0000 mg | ORAL_TABLET | Freq: Two times a day (BID) | ORAL | 0 refills | Status: DC

## 2017-05-29 NOTE — ED Triage Notes (Signed)
Pt presents with c/o hematuria and pain in his penis when he urinates

## 2017-05-29 NOTE — ED Provider Notes (Signed)
[facility] EMERGENCY DEPARTMENT Provider Note   CSN: [id number] Arrival date & time: 05/29/17  0353     History   Chief Complaint Chief Complaint  Patient presents with  . Hematuria    HPI Rodney [name] is a 47 y.o. male.  Patient is a 47 year old male with no significant past medical history.  He presents today for evaluation of left flank pain, burning with urination, and hematuria.  [facility] the same complaints.  He had a urinalysis, GC chlamydia, and other studies performed, however were all unremarkable.  This evening he again urinated and reports passing bloody urine.   The history is provided by the patient.  Hematuria  This is a new problem. The current episode started 2 days ago. Episode frequency: Intermittently. The problem has been gradually worsening. Pertinent negatives include no abdominal pain. Nothing aggravates the symptoms. Nothing relieves the symptoms.    History reviewed. No pertinent past medical history.  There are no active problems to display for this patient.   Past Surgical History:  Procedure Laterality Date  . SURGERY SCROTAL / TESTICULAR Right        Home Medications    Prior to Admission medications   Medication Sig Start Date End Date Taking? Authorizing Provider  diclofenac (CATAFLAM) 50 MG tablet Take 1 tablet (50 mg total) by mouth 2 (two) times daily. Patient not taking: Reported on 05/20/2017 04/11/16   Dione BoozeGlick, David, MD  methocarbamol (ROBAXIN) 500 MG tablet Take 1 tablet (500 mg total) by mouth 2 (two) times daily. Patient not taking: Reported on 05/20/2017 07/18/14   Santiago GladLaisure, Heather, PA-C  naproxen (NAPROSYN) 500 MG tablet Take 1 tablet (500 mg total) by mouth 2 (two) times daily. Patient not taking: Reported on 05/20/2017 04/09/16   Cheri Fowlerose, Kayla, PA-C  oxyCODONE-acetaminophen (PERCOCET/ROXICET) 5-325 MG tablet Take 1 tablet by mouth every 4 (four) hours as needed for severe  pain. Patient not taking: Reported on 05/20/2017 04/11/16   Dione BoozeGlick, David, MD  traMADol (ULTRAM) 50 MG tablet Take 1 tablet (50 mg total) by mouth every 6 (six) hours as needed. Patient not taking: Reported on 05/20/2017 07/18/14   Santiago GladLaisure, Heather, PA-C    Family History No family history on file.  Social History Social History   Tobacco Use  . Smoking status: Current Every Day Smoker    Types: Cigarettes  . Smokeless tobacco: Never Used  Substance Use Topics  . Alcohol use: No  . Drug use: Yes    Types: Marijuana    Comment: occ     Allergies   Patient has no known allergies.   Review of Systems Review of Systems  Gastrointestinal: Negative for abdominal pain.  Genitourinary: Positive for hematuria.  All other systems reviewed and are negative.    Physical Exam Updated Vital Signs BP 139/81 (BP Location: Right Arm)   Pulse 65   Temp 97.8 F (36.6 C) (Oral)   Resp 17   SpO2 100%   Physical Exam  Constitutional: He is oriented to person, place, and time. He appears well-developed and well-nourished. No distress.  HENT:  Head: Normocephalic and atraumatic.  Mouth/Throat: Oropharynx is clear and moist.  Neck: Normal range of motion. Neck supple.  Cardiovascular: Normal rate and regular rhythm. Exam reveals no friction rub.  No murmur heard. Pulmonary/Chest: Effort normal and breath sounds normal. No respiratory distress. He has no wheezes. He has no rales.  Abdominal: Soft. Bowel sounds are normal.  He exhibits no distension. There is no tenderness.  Musculoskeletal: Normal range of motion. He exhibits no edema.  Neurological: He is alert and oriented to person, place, and time. Coordination normal.  Skin: Skin is warm and dry. He is not diaphoretic.  Nursing note and vitals reviewed.    ED Treatments / Results  Labs (all labs ordered are listed, but only abnormal results are displayed) Labs Reviewed - No data to display  EKG  EKG Interpretation None        Radiology No results found.  Procedures Procedures (including critical care time)  Medications Ordered in ED Medications - No data to display   Initial Impression / Assessment and Plan / ED Course  I have reviewed the triage vital signs and the nursing notes.  Pertinent labs & imaging results that were available during my care of the patient were reviewed by me and considered in my medical decision making (see chart for details).  Patient presenting here with burning with urination, hematuria.  He was seen at [facility] and no definite cause was found.  He returns today stating his symptoms are no better.  He did have microscopic hematuria on recent urinalysis earlier this evening at [facility].  His CT scan reveals a thickened urinary bladder consistent with an infectious or inflammatory process.  He will be treated with Cipro and as needed return.  Final Clinical Impressions(s) / ED Diagnoses   Final diagnoses:  None    ED Discharge Orders    None       Geoffery Lyons, MD 05/29/17 [phone]

## 2017-05-29 NOTE — Discharge Instructions (Signed)
Cipro as prescribed.  Drink plenty of fluids.  Return to the ER if symptoms significantly worsen or change.

## 2017-05-29 NOTE — ED Triage Notes (Signed)
Pt reports dysuria, hematuria, flank pain X3 days. Pt states he was seen here recently for similar S/S, tested for STD which were negative.

## 2017-05-29 NOTE — ED Notes (Signed)
PT discharged to home with family. NAD. 

## 2018-05-29 ENCOUNTER — Emergency Department (HOSPITAL_BASED_OUTPATIENT_CLINIC_OR_DEPARTMENT_OTHER)
Admission: EM | Admit: 2018-05-29 | Discharge: 2018-05-29 | Disposition: A | Discharge status: Home / Self Care | Payer: Self-pay | Attending: Emergency Medicine | Admitting: Emergency Medicine

## 2018-05-29 ENCOUNTER — Other Ambulatory Visit: Payer: Self-pay

## 2018-05-29 ENCOUNTER — Encounter (HOSPITAL_BASED_OUTPATIENT_CLINIC_OR_DEPARTMENT_OTHER): Payer: Self-pay

## 2018-05-29 DIAGNOSIS — Z113 Encounter for screening for infections with a predominantly sexual mode of transmission: Secondary | ICD-10-CM | POA: Insufficient documentation

## 2018-05-29 DIAGNOSIS — Z711 Person with feared health complaint in whom no diagnosis is made: Secondary | ICD-10-CM

## 2018-05-29 DIAGNOSIS — F17210 Nicotine dependence, cigarettes, uncomplicated: Secondary | ICD-10-CM | POA: Insufficient documentation

## 2018-05-29 LAB — URINALYSIS, ROUTINE W REFLEX MICROSCOPIC
Bilirubin Urine: NEGATIVE
GLUCOSE, UA: NEGATIVE mg/dL
HGB URINE DIPSTICK: NEGATIVE
Ketones, ur: NEGATIVE mg/dL
Leukocytes, UA: NEGATIVE
Nitrite: NEGATIVE
PROTEIN: NEGATIVE mg/dL
Specific Gravity, Urine: >1.03 — ABNORMAL HIGH (ref 1.005–1.030)
pH: 5.5 (ref 5.0–8.0)

## 2018-05-29 MED ORDER — AZITHROMYCIN 250 MG PO TABS
1000.0000 mg | ORAL_TABLET | Freq: Once | ORAL | Status: AC
  Administered 2018-05-29: 1000 mg via ORAL
  Filled 2018-05-29: qty 4

## 2018-05-29 MED ORDER — CEFTRIAXONE SODIUM 250 MG IJ SOLR
250.0000 mg | Freq: Once | INTRAMUSCULAR | Status: AC
  Administered 2018-05-29: 250 mg via INTRAMUSCULAR
  Filled 2018-05-29: qty 250

## 2018-05-29 NOTE — ED Notes (Signed)
Patient left at this time with all belongings. 

## 2018-05-29 NOTE — Discharge Instructions (Signed)

## 2018-05-29 NOTE — ED Provider Notes (Signed)
[facility] EMERGENCY DEPARTMENT Provider Note   CSN: [id number] Arrival date & time: 05/29/18  2155     History   Chief Complaint No chief complaint on file.   HPI Rodney [name] is a 48 y.o. male who presents for evaluation of possible STD exposure.  Patient reports he was contacted by a new partner and states that he did to be checked for STDs.  He states he is not having any symptoms.  He states that he is currently sexually active with one partner and they do not use protection.  He states that this is a new partner does not know her history.  Patient denies any fevers, abdominal pain, penile discharge, dysuria, hematuria.  The history is provided by the patient.    History reviewed. No pertinent past medical history.  There are no active problems to display for this patient.   Past Surgical History:  Procedure Laterality Date  . SURGERY SCROTAL / TESTICULAR Right         Home Medications    Prior to Admission medications   Medication Sig Start Date End Date Taking? Authorizing Provider  ciprofloxacin (CIPRO) 500 MG tablet Take 1 tablet (500 mg total) by mouth 2 (two) times daily. One po bid x 7 days 05/29/17   Geoffery Lyonselo, [name], MD  diclofenac (CATAFLAM) 50 MG tablet Take 1 tablet (50 mg total) by mouth 2 (two) times daily. Patient not taking: Reported on 05/20/2017 04/11/16   Dione BoozeGlick, David, MD  methocarbamol (ROBAXIN) 500 MG tablet Take 1 tablet (500 mg total) by mouth 2 (two) times daily. Patient not taking: Reported on 05/20/2017 07/18/14   Santiago GladLaisure, Heather, PA-C  naproxen (NAPROSYN) 500 MG tablet Take 1 tablet (500 mg total) by mouth 2 (two) times daily. Patient not taking: Reported on 05/20/2017 04/09/16   Cheri Fowlerose, Kayla, PA-C  oxyCODONE-acetaminophen (PERCOCET/ROXICET) 5-325 MG tablet Take 1 tablet by mouth every 4 (four) hours as needed for severe pain. Patient not taking: Reported on 05/20/2017 04/11/16   Dione BoozeGlick, David, MD  traMADol (ULTRAM) 50 MG tablet Take 1  tablet (50 mg total) by mouth every 6 (six) hours as needed. Patient not taking: Reported on 05/20/2017 07/18/14   Santiago GladLaisure, Heather, PA-C    Family History No family history on file.  Social History Social History   Tobacco Use  . Smoking status: Current Every Day Smoker    Types: Cigarettes  . Smokeless tobacco: Never Used  Substance Use Topics  . Alcohol use: No  . Drug use: Yes    Types: Marijuana    Comment: occ     Allergies   Patient has no known allergies.   Review of Systems Review of Systems  Constitutional: Negative for fatigue.  Genitourinary: Negative for discharge, dysuria, hematuria and penile pain.  All other systems reviewed and are negative.    Physical Exam Updated Vital Signs BP 135/65   Pulse 76   Temp 98.6 F (37 C)   Resp 17   Ht 5\' 10"  (1.778 m)   Wt 76.2 kg   SpO2 100%   BMI 24.11 kg/m   Physical Exam Vitals signs and nursing note reviewed. Exam conducted with a chaperone present.  Constitutional:      Appearance: He is well-developed.  HENT:     Head: Normocephalic and atraumatic.  Eyes:     General: No scleral icterus.       Right eye: No discharge.        Left eye: No discharge.  Conjunctiva/sclera: Conjunctivae normal.  Pulmonary:     Effort: Pulmonary effort is normal.  Genitourinary:    Penis: Circumcised. No discharge.      Scrotum/Testes:        Right: Tenderness or swelling not present.     Comments: The exam was performed with a chaperone present. Normal male genitalia. No evidence of rash, ulcers or lesions.  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 Skin:    General: Skin is warm and dry.  Neurological:     Mental Status: He is alert.  Psychiatric:        Speech: Speech normal.        Behavior: Behavior normal.      ED Treatments / Results  Labs (all labs ordered are listed, but only abnormal results are displayed) Labs Reviewed  URINALYSIS, ROUTINE W REFLEX MICROSCOPIC - Abnormal; Notable for the following  components:      Result Value   Specific Gravity, Urine >1.030 (*)    All other components within normal limits  GC/CHLAMYDIA PROBE AMP ([place]) NOT AT [facility]    EKG None  Radiology No results found.  Procedures Procedures (including critical care time)  Medications Ordered in ED Medications  cefTRIAXone (ROCEPHIN) injection 250 mg (250 mg Intramuscular Given 05/29/18 2231)  azithromycin (ZITHROMAX) tablet 1,000 mg (1,000 mg Oral Given 05/29/18 2231)     Initial Impression / Assessment and Plan / ED Course  I have reviewed the triage vital signs and the nursing notes.  Pertinent labs & imaging results that were available during my care of the patient were reviewed by me and considered in my medical decision making (see chart for details).     48 y.o. M who presents for evaluation of possible STD exposure. Patient is afebrile, non-toxic appearing, sitting comfortably on examination table. Vital signs reviewed and stable. GU exam shows left testicle absent. Otherwise normal.  No tenderness to palpation of the testes or epididymis to suggest orchitis or epididymitis.  STD cultures obtained and pending. Discussed with patient treatment options including treatment today or waiting until cultures returned for treatment.  Patient wishes to have treatment today.   Discussed importance of using protection when sexually active. Pt understands that they have GC/Chlamydia cultures pending and that they will need to inform all sexual partners if results return positive. Patient has been treated prophylactically . Patient had ample opportunity for questions and discussion. All patient's questions were answered with full understanding. Strict return precautions discussed. Patient expresses understanding and agreement to plan.   Portions of this note were generated with Scientist, clinical (histocompatibility and immunogenetics). Dictation errors may occur despite best attempts at proofreading.   Final Clinical Impressions(s) / ED  Diagnoses   Final diagnoses:  Concern about STD in male without diagnosis    ED Discharge Orders    None       Maxwell Caul, PA-C 05/29/18 2243    Melene Plan, DO 05/29/18 2335

## 2018-05-29 NOTE — ED Triage Notes (Signed)
Pt wants to be checked for STDS- denies symptoms.

## 2018-05-31 LAB — GC/CHLAMYDIA PROBE AMP (~~LOC~~) NOT AT ARMC
Chlamydia: NEGATIVE
Neisseria Gonorrhea: NEGATIVE

## 2018-06-03 ENCOUNTER — Telehealth (HOSPITAL_COMMUNITY): Payer: Self-pay

## 2018-11-20 ENCOUNTER — Emergency Department (HOSPITAL_BASED_OUTPATIENT_CLINIC_OR_DEPARTMENT_OTHER)
Admission: EM | Admit: 2018-11-20 | Discharge: 2018-11-20 | Disposition: A | Discharge status: Home / Self Care | Payer: Self-pay | Attending: Emergency Medicine | Admitting: Emergency Medicine

## 2018-11-20 ENCOUNTER — Other Ambulatory Visit: Payer: Self-pay

## 2018-11-20 ENCOUNTER — Encounter (HOSPITAL_BASED_OUTPATIENT_CLINIC_OR_DEPARTMENT_OTHER): Payer: Self-pay | Admitting: Emergency Medicine

## 2018-11-20 DIAGNOSIS — F17210 Nicotine dependence, cigarettes, uncomplicated: Secondary | ICD-10-CM | POA: Insufficient documentation

## 2018-11-20 DIAGNOSIS — Z711 Person with feared health complaint in whom no diagnosis is made: Secondary | ICD-10-CM

## 2018-11-20 DIAGNOSIS — Z202 Contact with and (suspected) exposure to infections with a predominantly sexual mode of transmission: Secondary | ICD-10-CM | POA: Insufficient documentation

## 2018-11-20 MED ORDER — AZITHROMYCIN 250 MG PO TABS
1000.0000 mg | ORAL_TABLET | Freq: Once | ORAL | Status: AC
  Administered 2018-11-20: 23:00:00 1000 mg via ORAL
  Filled 2018-11-20: qty 4

## 2018-11-20 MED ORDER — CEFTRIAXONE SODIUM 250 MG IJ SOLR
250.0000 mg | Freq: Once | INTRAMUSCULAR | Status: AC
  Administered 2018-11-20: 250 mg via INTRAMUSCULAR
  Filled 2018-11-20: qty 250

## 2018-11-20 MED ORDER — LIDOCAINE HCL (PF) 1 % IJ SOLN
INTRAMUSCULAR | Status: AC
  Filled 2018-11-20: qty 5

## 2018-11-20 MED ORDER — LIDOCAINE HCL (PF) 1 % IJ SOLN
0.9000 mL | Freq: Once | INTRAMUSCULAR | Status: AC
  Administered 2018-11-20: 0.9 mL

## 2018-11-20 NOTE — ED Notes (Signed)
Notified patient that we need urine specimen.

## 2018-11-20 NOTE — ED Provider Notes (Signed)
[facility] EMERGENCY DEPARTMENT Provider Note   CSN: [id number] Arrival date & time: 11/20/18  2212    History   Chief Complaint Chief Complaint  Patient presents with  . Exposure to STD    HPI Alta CorningStevon J Zajkowski is a 48 y.o. male.     The history is provided by the patient.  Exposure to STD This is a new problem. The problem has not changed since onset.Nothing aggravates the symptoms. Nothing relieves the symptoms. He has tried nothing for the symptoms. The treatment provided no relief.    History reviewed. No pertinent past medical history.  There are no active problems to display for this patient.   Past Surgical History:  Procedure Laterality Date  . SURGERY SCROTAL / TESTICULAR Right         Home Medications    Prior to Admission medications   Medication Sig Start Date End Date Taking? Authorizing Provider  ciprofloxacin (CIPRO) 500 MG tablet Take 1 tablet (500 mg total) by mouth 2 (two) times daily. One po bid x 7 days 05/29/17   Geoffery Lyonselo, [name], MD  diclofenac (CATAFLAM) 50 MG tablet Take 1 tablet (50 mg total) by mouth 2 (two) times daily. Patient not taking: Reported on 05/20/2017 04/11/16   Dione BoozeGlick, David, MD  methocarbamol (ROBAXIN) 500 MG tablet Take 1 tablet (500 mg total) by mouth 2 (two) times daily. Patient not taking: Reported on 05/20/2017 07/18/14   Santiago GladLaisure, Heather, PA-C  naproxen (NAPROSYN) 500 MG tablet Take 1 tablet (500 mg total) by mouth 2 (two) times daily. Patient not taking: Reported on 05/20/2017 04/09/16   Cheri Fowlerose, Kayla, PA-C  oxyCODONE-acetaminophen (PERCOCET/ROXICET) 5-325 MG tablet Take 1 tablet by mouth every 4 (four) hours as needed for severe pain. Patient not taking: Reported on 05/20/2017 04/11/16   Dione BoozeGlick, David, MD  traMADol (ULTRAM) 50 MG tablet Take 1 tablet (50 mg total) by mouth every 6 (six) hours as needed. Patient not taking: Reported on 05/20/2017 07/18/14   Santiago GladLaisure, Heather, PA-C    Family History No family history on  file.  Social History Social History   Tobacco Use  . Smoking status: Current Every Day Smoker    Types: Cigarettes  . Smokeless tobacco: Never Used  Substance Use Topics  . Alcohol use: No  . Drug use: Yes    Types: Marijuana    Comment: occ     Allergies   Patient has no known allergies.   Review of Systems Review of Systems  Genitourinary: Negative for decreased urine volume, difficulty urinating, discharge, dysuria, enuresis, flank pain, frequency, genital sores, hematuria, penile pain, penile swelling, scrotal swelling, testicular pain and urgency.     Physical Exam Updated Vital Signs  ED Triage Vitals  Enc Vitals Group     BP 11/20/18 2220 138/84     Pulse Rate 11/20/18 2220 84     Resp 11/20/18 2220 20     Temp 11/20/18 2220 98.6 F (37 C)     Temp Source 11/20/18 2220 Oral     SpO2 11/20/18 2220 99 %     Weight 11/20/18 2222 160 lb (72.6 kg)     Height 11/20/18 2222 6' (1.829 m)     Head Circumference --      Peak Flow --      Pain Score 11/20/18 2222 0     Pain Loc --      Pain Edu? --      Excl. in GC? --  Physical Exam Constitutional:      General: He is not in acute distress.    Appearance: He is not ill-appearing.  HENT:     Head: Normocephalic.  Genitourinary:    Comments: Deferred per patient prefence Skin:    General: Skin is warm.  Neurological:     Mental Status: He is alert.      ED Treatments / Results  Labs (all labs ordered are listed, but only abnormal results are displayed) Labs Reviewed  GC/CHLAMYDIA PROBE AMP ([place]) NOT AT [facility]    EKG None  Radiology No results found.  Procedures Procedures (including critical care time)  Medications Ordered in ED Medications  cefTRIAXone (ROCEPHIN) injection 250 mg (250 mg Intramuscular Given 11/20/18 2239)  azithromycin (ZITHROMAX) tablet 1,000 mg (1,000 mg Oral Given 11/20/18 2239)  lidocaine (PF) (XYLOCAINE) 1 % injection 0.9 mL (0.9 mLs Infiltration Given 11/20/18  2239)     Initial Impression / Assessment and Plan / ED Course  I have reviewed the triage vital signs and the nursing notes.  Pertinent labs & imaging results that were available during my care of the patient were reviewed by me and considered in my medical decision making (see chart for details).     TOSH GLAZE is a 48 year old male here for STD treatment.  He does not have any symptoms.  Thinks he had an exposure.  Does not know of what.  Will treat empirically for gonorrhea and chlamydia.  Treated with Rocephin and Zithromax.  Discharged from ED in good condition.  This chart was dictated using voice recognition software.  Despite best efforts to proofread,  errors can occur which can change the documentation meaning.    Final Clinical Impressions(s) / ED Diagnoses   Final diagnoses:  Concern about STD in male without diagnosis    ED Discharge Orders    None       Lennice Sites, DO 11/20/18 2246

## 2018-11-20 NOTE — ED Notes (Signed)
ED Provider at bedside. 

## 2018-11-20 NOTE — ED Triage Notes (Signed)
Wants to be check for STD, no sx. States had exposure.

## 2018-11-22 LAB — GC/CHLAMYDIA PROBE AMP (~~LOC~~) NOT AT ARMC
Chlamydia: NEGATIVE
Neisseria Gonorrhea: NEGATIVE

## 2018-11-29 ENCOUNTER — Encounter (HOSPITAL_BASED_OUTPATIENT_CLINIC_OR_DEPARTMENT_OTHER): Payer: Self-pay | Admitting: *Deleted

## 2018-11-29 ENCOUNTER — Other Ambulatory Visit: Payer: Self-pay

## 2018-11-29 ENCOUNTER — Emergency Department (HOSPITAL_BASED_OUTPATIENT_CLINIC_OR_DEPARTMENT_OTHER)
Admission: EM | Admit: 2018-11-29 | Discharge: 2018-11-30 | Disposition: A | Discharge status: Home / Self Care | Payer: Self-pay | Attending: Emergency Medicine | Admitting: Emergency Medicine

## 2018-11-29 DIAGNOSIS — Z113 Encounter for screening for infections with a predominantly sexual mode of transmission: Secondary | ICD-10-CM | POA: Insufficient documentation

## 2018-11-29 DIAGNOSIS — F17210 Nicotine dependence, cigarettes, uncomplicated: Secondary | ICD-10-CM | POA: Insufficient documentation

## 2018-11-29 LAB — URINALYSIS, ROUTINE W REFLEX MICROSCOPIC
Bilirubin Urine: NEGATIVE
Glucose, UA: NEGATIVE mg/dL
Hgb urine dipstick: NEGATIVE
Ketones, ur: NEGATIVE mg/dL
Leukocytes,Ua: NEGATIVE
Nitrite: NEGATIVE
Protein, ur: NEGATIVE mg/dL
Specific Gravity, Urine: >1.03 — ABNORMAL HIGH (ref 1.005–1.030)
pH: 6 (ref 5.0–8.0)

## 2018-11-29 NOTE — ED Triage Notes (Signed)
States he wants to be checked for STD. He was treated for STD 9 ago but had sex with the same person before she was treated.

## 2018-11-30 MED ORDER — CEFTRIAXONE SODIUM 250 MG IJ SOLR
250.0000 mg | Freq: Once | INTRAMUSCULAR | Status: AC
  Administered 2018-11-30: 250 mg via INTRAMUSCULAR
  Filled 2018-11-30: qty 250

## 2018-11-30 MED ORDER — AZITHROMYCIN 250 MG PO TABS
1000.0000 mg | ORAL_TABLET | Freq: Once | ORAL | Status: AC
  Administered 2018-11-30: 1000 mg via ORAL
  Filled 2018-11-30: qty 4

## 2018-11-30 NOTE — ED Provider Notes (Signed)
TIME SEEN: 12:07 AM  CHIEF COMPLAINT: STD check  HPI: Patient is a 48 year old male here for STD screening.  His wife is here for the same.  States he is having some discomfort but denies dysuria, penile discharge, testicular pain, scrotal swelling, hematuria, rash or lesions to his genitalia.  Was given Rocephin and azithromycin on August 2 for presumed STD exposure.  States he is recently had a negative HIV test.  He agrees to syphilis testing today.   ROS: See HPI Constitutional: no fever  Eyes: no drainage  ENT: no runny nose   Cardiovascular:  no chest pain  Resp: no SOB  GI: no vomiting GU: no dysuria Integumentary: no rash  Allergy: no hives  Musculoskeletal: no leg swelling  Neurological: no slurred speech ROS otherwise negative  PAST MEDICAL HISTORY/PAST SURGICAL HISTORY:  History reviewed. No pertinent past medical history.  MEDICATIONS:  Prior to Admission medications   Medication Sig Start Date End Date Taking? Authorizing Provider  ciprofloxacin (CIPRO) 500 MG tablet Take 1 tablet (500 mg total) by mouth 2 (two) times daily. One po bid x 7 days 05/29/17   Geoffery Lyonselo, [name], MD  diclofenac (CATAFLAM) 50 MG tablet Take 1 tablet (50 mg total) by mouth 2 (two) times daily. Patient not taking: Reported on 05/20/2017 04/11/16   Dione BoozeGlick, David, MD  methocarbamol (ROBAXIN) 500 MG tablet Take 1 tablet (500 mg total) by mouth 2 (two) times daily. Patient not taking: Reported on 05/20/2017 07/18/14   Santiago GladLaisure, Heather, PA-C  naproxen (NAPROSYN) 500 MG tablet Take 1 tablet (500 mg total) by mouth 2 (two) times daily. Patient not taking: Reported on 05/20/2017 04/09/16   Cheri Fowlerose, Kayla, PA-C  oxyCODONE-acetaminophen (PERCOCET/ROXICET) 5-325 MG tablet Take 1 tablet by mouth every 4 (four) hours as needed for severe pain. Patient not taking: Reported on 05/20/2017 04/11/16   Dione BoozeGlick, David, MD  traMADol (ULTRAM) 50 MG tablet Take 1 tablet (50 mg total) by mouth every 6 (six) hours as needed. Patient  not taking: Reported on 05/20/2017 07/18/14   Santiago GladLaisure, Heather, PA-C    ALLERGIES:  No Known Allergies  SOCIAL HISTORY:  Social History   Tobacco Use  . Smoking status: Current Every Day Smoker    Types: Cigarettes  . Smokeless tobacco: Never Used  Substance Use Topics  . Alcohol use: No    FAMILY HISTORY: No family history on file.  EXAM: BP 115/67 (BP Location: Right Arm)   Pulse 73   Temp 98.5 F (36.9 C) (Oral)   Resp 16   Ht 6' (1.829 m)   Wt 78 kg   SpO2 100%   BMI 23.33 kg/m  CONSTITUTIONAL: Alert and oriented and responds appropriately to questions. Well-appearing; well-nourished HEAD: Normocephalic EYES: Conjunctivae clear, pupils appear equal, EOMI ENT: normal nose; moist mucous membranes NECK: Supple, no meningismus, no nuchal rigidity, no LAD  CARD: RRR; S1 and S2 appreciated; no murmurs, no clicks, no rubs, no gallops RESP: Normal chest excursion without splinting or tachypnea; breath sounds clear and equal bilaterally; no wheezes, no rhonchi, no rales, no hypoxia or respiratory distress, speaking full sentences ABD/GI: Normal bowel sounds; non-distended; soft, non-tender, no rebound, no guarding, no peritoneal signs, no hepatosplenomegaly GU: Patient declines BACK:  The back appears normal and is non-tender to palpation, there is no CVA tenderness EXT: Normal ROM in all joints; non-tender to palpation; no edema; normal capillary refill; no cyanosis, no calf tenderness or swelling    SKIN: Normal color for age and race; warm; no  rash NEURO: Moves all extremities equally PSYCH: The patient's mood and manner are appropriate. Grooming and personal hygiene are appropriate.  MEDICAL DECISION MAKING: Patient here wanting treatment for gonorrhea and chlamydia.  States he thinks he is been exposed to an STD.  It appears his wife was diagnosed with bacterial vaginosis.  Have explained to him that this is not an STD but he would still like STD treatment.  States he is  having some discomfort but is very vague about this and declines a GU exam.  Will treat empirically with Rocephin and azithromycin.  Have recommended HIV and syphilis.  He refuses the HIV test but agrees to syphilis testing.  Have advised him to avoid any sexual intercourse for at least 1 week after both him and his partner have been treated for STDs.  At this time, I do not feel there is any life-threatening condition present. I have reviewed and discussed all results (EKG, imaging, lab, urine as appropriate) and exam findings with patient/family. I have reviewed nursing notes and appropriate previous records.  I feel the patient is safe to be discharged home without further emergent workup and can continue workup as an outpatient as needed. Discussed usual and customary return precautions. Patient/family verbalize understanding and are comfortable with this plan.  Outpatient follow-up has been provided as needed. All questions have been answered.      Jamille Fisher, Delice Bison, DO 11/30/18 [phone]

## 2018-11-30 NOTE — Discharge Instructions (Addendum)
You have been given treatment for gonorrhea and chlamydia today.  Please avoid sexual intercourse with your partner for at least 1 week until both of you have been tested and treated.  Steps to find a Primary Care Provider (PCP):  Call [phone] or [phone] to access "[facility] a Doctor Service."  2.  You may also go on the [facility] website at [email or web address]  3.  [facility] also frequently accepts new patients.  [facility] [phone]  4.  There are also multiple [facility], [facility] and [facility] practices throughout the [place] that are frequently accepting new patients. You may find a clinic that is close to your home and contact them.  [facility] [email or web address] [phone]  [facility] [email or web address]  [email or web address] [place] [email or web address] [phone]  5.  Local Health Departments also can provide primary care services.  [facility]  [place] [address] [phone]  [facility] [place] [address] [place] [phone]

## 2018-12-01 LAB — RPR: RPR Ser Ql: NONREACTIVE

## 2019-03-29 ENCOUNTER — Other Ambulatory Visit: Payer: Self-pay

## 2019-03-29 ENCOUNTER — Encounter (HOSPITAL_BASED_OUTPATIENT_CLINIC_OR_DEPARTMENT_OTHER): Payer: Self-pay | Admitting: *Deleted

## 2019-03-29 ENCOUNTER — Emergency Department (HOSPITAL_BASED_OUTPATIENT_CLINIC_OR_DEPARTMENT_OTHER)
Admission: EM | Admit: 2019-03-29 | Discharge: 2019-03-29 | Disposition: A | Discharge status: Home / Self Care | Payer: Self-pay | Attending: Emergency Medicine | Admitting: Emergency Medicine

## 2019-03-29 DIAGNOSIS — Z202 Contact with and (suspected) exposure to infections with a predominantly sexual mode of transmission: Secondary | ICD-10-CM | POA: Insufficient documentation

## 2019-03-29 DIAGNOSIS — F17210 Nicotine dependence, cigarettes, uncomplicated: Secondary | ICD-10-CM | POA: Insufficient documentation

## 2019-03-29 LAB — URINALYSIS, ROUTINE W REFLEX MICROSCOPIC
Bilirubin Urine: NEGATIVE
Glucose, UA: NEGATIVE mg/dL
Hgb urine dipstick: NEGATIVE
Ketones, ur: NEGATIVE mg/dL
Leukocytes,Ua: NEGATIVE
Nitrite: NEGATIVE
Protein, ur: NEGATIVE mg/dL
Specific Gravity, Urine: 1.025 (ref 1.005–1.030)
pH: 7 (ref 5.0–8.0)

## 2019-03-29 MED ORDER — AZITHROMYCIN 250 MG PO TABS
1000.0000 mg | ORAL_TABLET | Freq: Once | ORAL | Status: AC
  Administered 2019-03-29: 1000 mg via ORAL
  Filled 2019-03-29: qty 4

## 2019-03-29 MED ORDER — CEFTRIAXONE SODIUM 250 MG IJ SOLR
250.0000 mg | Freq: Once | INTRAMUSCULAR | Status: AC
Start: 2019-03-29 — End: 2019-03-29
  Administered 2019-03-29: 250 mg via INTRAMUSCULAR
  Filled 2019-03-29: qty 250

## 2019-03-29 NOTE — ED Provider Notes (Signed)
[facility] EMERGENCY DEPARTMENT Provider Note   CSN: [id number] Arrival date & time: 03/29/19  1820     History   Chief Complaint Chief Complaint  Patient presents with  . Exposure to STD    HPI Rodney [name] is a 48 y.o. male.     Patient presents indicating a sexual partner informed him that they tested positive for gonorrhea. Patient presents requesting tx. Patient denies any symptoms. No penile discharge. No dysuria or other gu symptoms. No abd pain. No scrotal or testicular pain. No fever or chills. No rash.   The history is provided by the patient.  Exposure to STD Pertinent negatives include no abdominal pain.    History reviewed. No pertinent past medical history.  There are no active problems to display for this patient.   Past Surgical History:  Procedure Laterality Date  . SURGERY SCROTAL / TESTICULAR Right         Home Medications    Prior to Admission medications   Medication Sig Start Date End Date Taking? Authorizing Provider  ciprofloxacin (CIPRO) 500 MG tablet Take 1 tablet (500 mg total) by mouth 2 (two) times daily. One po bid x 7 days 05/29/17   Veryl Speak, MD  diclofenac (CATAFLAM) 50 MG tablet Take 1 tablet (50 mg total) by mouth 2 (two) times daily. Patient not taking: Reported on 3/54/6568 12/75/17   Delora Fuel, MD  methocarbamol (ROBAXIN) 500 MG tablet Take 1 tablet (500 mg total) by mouth 2 (two) times daily. Patient not taking: Reported on 05/20/2017 07/18/14   Hyman Bible, PA-C  naproxen (NAPROSYN) 500 MG tablet Take 1 tablet (500 mg total) by mouth 2 (two) times daily. Patient not taking: Reported on 05/20/2017 04/09/16   Gloriann Loan, PA-C  oxyCODONE-acetaminophen (PERCOCET/ROXICET) 5-325 MG tablet Take 1 tablet by mouth every 4 (four) hours as needed for severe pain. Patient not taking: Reported on 0/04/7492 49/67/59   Delora Fuel, MD  traMADol (ULTRAM) 50 MG tablet Take 1 tablet (50 mg total) by mouth every 6 (six)  hours as needed. Patient not taking: Reported on 05/20/2017 07/18/14   Hyman Bible, PA-C    Family History No family history on file.  Social History Social History   Tobacco Use  . Smoking status: Current Every Day Smoker    Packs/day: 0.50    Types: Cigarettes  . Smokeless tobacco: Never Used  Substance Use Topics  . Alcohol use: No  . Drug use: Yes    Types: Marijuana    Comment: occ     Allergies   Patient has no known allergies.   Review of Systems Review of Systems  Constitutional: Negative for fever.  HENT: Negative for sore throat.   Gastrointestinal: Negative for abdominal pain.  Genitourinary: Negative for discharge and dysuria.  Musculoskeletal: Negative for arthralgias.  Skin: Negative for rash.     Physical Exam Updated Vital Signs BP 127/64   Pulse 68   Temp 98.9 F (37.2 C)   Resp 16   Ht 1.803 m (5\' 11" )   Wt 77.6 kg   SpO2 100%   BMI 23.85 kg/m   Physical Exam Vitals signs and nursing note reviewed.  Constitutional:      Appearance: Normal appearance. He is well-developed.  HENT:     Head: Atraumatic.     Nose: Nose normal.     Mouth/Throat:     Mouth: Mucous membranes are moist.  Eyes:     General: No scleral icterus.  Conjunctiva/sclera: Conjunctivae normal.  Neck:     Musculoskeletal: Neck supple. No neck rigidity.     Trachea: No tracheal deviation.  Cardiovascular:     Rate and Rhythm: Normal rate.     Pulses: Normal pulses.  Pulmonary:     Effort: Pulmonary effort is normal. No accessory muscle usage or respiratory distress.  Abdominal:     General: There is no distension.     Palpations: Abdomen is soft.     Tenderness: There is no abdominal tenderness.  Genitourinary:    Comments: No cva tenderness. Normal external gu exam.  Musculoskeletal:        General: No swelling.  Skin:    General: Skin is warm and dry.     Findings: No rash.  Neurological:     Mental Status: He is alert.     Comments: Alert,  speech clear.   Psychiatric:        Mood and Affect: Mood normal.      ED Treatments / Results  Labs (all labs ordered are listed, but only abnormal results are displayed) Results for orders placed or performed during the hospital encounter of 03/29/19  Urinalysis, Routine w reflex microscopic  Result Value Ref Range   Color, Urine YELLOW YELLOW   APPearance CLEAR CLEAR   Specific Gravity, Urine 1.025 1.005 - 1.030   pH 7.0 5.0 - 8.0   Glucose, UA NEGATIVE NEGATIVE mg/dL   Hgb urine dipstick NEGATIVE NEGATIVE   Bilirubin Urine NEGATIVE NEGATIVE   Ketones, ur NEGATIVE NEGATIVE mg/dL   Protein, ur NEGATIVE NEGATIVE mg/dL   Nitrite NEGATIVE NEGATIVE   Leukocytes,Ua NEGATIVE NEGATIVE   EKG None  Radiology No results found.  Procedures Procedures (including critical care time)  Medications Ordered in ED Medications  cefTRIAXone (ROCEPHIN) injection 250 mg (has no administration in time range)  azithromycin (ZITHROMAX) tablet 1,000 mg (has no administration in time range)     Initial Impression / Assessment and Plan / ED Course  I have reviewed the triage vital signs and the nursing notes.  Pertinent labs & imaging results that were available during my care of the patient were reviewed by me and considered in my medical decision making (see chart for details).  Pt requests tx. Pt is otherwise asymptomatic.   Confirmed nkda w pt.   Rocephin im. zithromax po.  Reviewed nursing notes and prior charts for additional history.   Labs sent.  Labs reviewed/interpreted by me - ua neg for uti.  Additional labs remain pending.  Patient currently appears stable for d/c.    Final Clinical Impressions(s) / ED Diagnoses   Final diagnoses:  None    ED Discharge Orders    None       Cathren Laine, MD 03/29/19 1914

## 2019-03-29 NOTE — Discharge Instructions (Signed)
It was our pleasure to provide your ER care today - we hope that you feel better.  Return to ER if worse, new symptoms, fevers, new or severe pain, or other concern.

## 2019-03-29 NOTE — ED Triage Notes (Signed)
Pt c/o exposure to gonorrhea denies symptoms

## 2019-03-31 LAB — GC/CHLAMYDIA PROBE AMP (~~LOC~~) NOT AT ARMC
Chlamydia: NEGATIVE
Neisseria Gonorrhea: NEGATIVE

## 2019-05-04 ENCOUNTER — Encounter (HOSPITAL_COMMUNITY): Payer: Self-pay | Admitting: Emergency Medicine

## 2019-05-04 ENCOUNTER — Emergency Department (HOSPITAL_COMMUNITY)
Admission: EM | Admit: 2019-05-04 | Discharge: 2019-05-04 | Disposition: A | Discharge status: Home / Self Care | Payer: Self-pay | Attending: Emergency Medicine | Admitting: Emergency Medicine

## 2019-05-04 ENCOUNTER — Other Ambulatory Visit: Payer: Self-pay

## 2019-05-04 DIAGNOSIS — Z202 Contact with and (suspected) exposure to infections with a predominantly sexual mode of transmission: Secondary | ICD-10-CM | POA: Insufficient documentation

## 2019-05-04 DIAGNOSIS — F17210 Nicotine dependence, cigarettes, uncomplicated: Secondary | ICD-10-CM | POA: Insufficient documentation

## 2019-05-04 LAB — URINALYSIS, ROUTINE W REFLEX MICROSCOPIC
Bilirubin Urine: NEGATIVE
Glucose, UA: NEGATIVE mg/dL
Hgb urine dipstick: NEGATIVE
Ketones, ur: NEGATIVE mg/dL
Leukocytes,Ua: NEGATIVE
Nitrite: NEGATIVE
Protein, ur: NEGATIVE mg/dL
Specific Gravity, Urine: 1.004 — ABNORMAL LOW (ref 1.005–1.030)
pH: 5 (ref 5.0–8.0)

## 2019-05-04 LAB — HIV ANTIBODY (ROUTINE TESTING W REFLEX): HIV Screen 4th Generation wRfx: NONREACTIVE

## 2019-05-04 MED ORDER — LIDOCAINE HCL (PF) 1 % IJ SOLN
INTRAMUSCULAR | Status: AC
  Filled 2019-05-04: qty 5

## 2019-05-04 MED ORDER — CEFTRIAXONE SODIUM 500 MG IJ SOLR
250.0000 mg | Freq: Once | INTRAMUSCULAR | Status: AC
  Administered 2019-05-04: 21:00:00 250 mg via INTRAMUSCULAR
  Filled 2019-05-04: qty 500

## 2019-05-04 MED ORDER — AZITHROMYCIN 250 MG PO TABS
1000.0000 mg | ORAL_TABLET | Freq: Once | ORAL | Status: AC
  Administered 2019-05-04: 1000 mg via ORAL
  Filled 2019-05-04: qty 4

## 2019-05-04 NOTE — ED Triage Notes (Signed)
Pt st's his lady friend tested positive for STD and he wants to be checked.  Pt denies any symptoms

## 2019-05-04 NOTE — Discharge Instructions (Addendum)
Please read the instructions below.  Talk with your primary care provider about any new medications.  You have been treated today for gonorrhea and chlamydia. You will receive a call from the hospital if your test results come back positive. Avoid sexual activity until you know your test results. If your results come back positive, it is important that you inform all of your sexual partners.  Return to the ER for new or worsening symptoms.

## 2019-05-04 NOTE — ED Provider Notes (Signed)
[facility] EMERGENCY DEPARTMENT Provider Note   CSN: [id number] Arrival date & time: 05/04/19  2014     History Chief Complaint  Patient presents with  . STD Check    Rodney [name] is a 49 y.o. male presenting to the ED with complaint of possible STD exposure.  He states his male partner told him she had a "bacterial infection."  He is unsure of what treatment she received and of the actual diagnosis. He states he is in a monogamous relationship with this person.  He denies any symptoms including no penile discharge, rashes or lesions, testicular pain or swelling, abdominal pain, nausea, vomiting, fever, pain with bowel movements.  The history is provided by the patient.       History reviewed. No pertinent past medical history.  There are no problems to display for this patient.   Past Surgical History:  Procedure Laterality Date  . SURGERY SCROTAL / TESTICULAR Right        No family history on file.  Social History   Tobacco Use  . Smoking status: Current Every Day Smoker    Packs/day: 0.50    Types: Cigarettes  . Smokeless tobacco: Never Used  Substance Use Topics  . Alcohol use: No  . Drug use: Yes    Types: Marijuana    Comment: occ    Home Medications Prior to Admission medications   Medication Sig Start Date End Date Taking? Authorizing Provider  ciprofloxacin (CIPRO) 500 MG tablet Take 1 tablet (500 mg total) by mouth 2 (two) times daily. One po bid x 7 days 05/29/17   Veryl Speak, MD  diclofenac (CATAFLAM) 50 MG tablet Take 1 tablet (50 mg total) by mouth 2 (two) times daily. Patient not taking: Reported on 7/42/5956 38/75/64   Delora Fuel, MD  methocarbamol (ROBAXIN) 500 MG tablet Take 1 tablet (500 mg total) by mouth 2 (two) times daily. Patient not taking: Reported on 05/20/2017 07/18/14   Hyman Bible, PA-C  naproxen (NAPROSYN) 500 MG tablet Take 1 tablet (500 mg total) by mouth 2 (two) times daily. Patient not taking:  Reported on 05/20/2017 04/09/16   Gloriann Loan, PA-C  oxyCODONE-acetaminophen (PERCOCET/ROXICET) 5-325 MG tablet Take 1 tablet by mouth every 4 (four) hours as needed for severe pain. Patient not taking: Reported on 3/32/9518 84/16/60   Delora Fuel, MD  traMADol (ULTRAM) 50 MG tablet Take 1 tablet (50 mg total) by mouth every 6 (six) hours as needed. Patient not taking: Reported on 05/20/2017 07/18/14   Hyman Bible, PA-C    Allergies    Patient has no known allergies.  Review of Systems   Review of Systems  All other systems reviewed and are negative.   Physical Exam Updated Vital Signs BP 118/68 (BP Location: Left Arm)   Pulse 64   Temp 98.2 F (36.8 C) (Oral)   Resp 15   Ht 5\' 11"  (1.803 m)   Wt 78 kg   SpO2 100%   BMI 23.99 kg/m   Physical Exam Vitals and nursing note reviewed. Exam conducted with a chaperone present.  Constitutional:      Appearance: He is well-developed.  HENT:     Head: Normocephalic and atraumatic.  Eyes:     Conjunctiva/sclera: Conjunctivae normal.  Cardiovascular:     Rate and Rhythm: Normal rate.  Pulmonary:     Effort: Pulmonary effort is normal.  Genitourinary:    Penis: Circumcised.      Comments: Chronic-appearing adhesions to dorsal  aspect of penis at proximal glans. No rashes, lesions, or tenderness. No scrotal swelling. 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 No TTP or swelling to epididymis or testicle. No obvious penile discharge. Neurological:     Mental Status: He is alert.  Psychiatric:        Mood and Affect: Mood normal.        Behavior: Behavior normal.     ED Results / Procedures / Treatments   Labs (all labs ordered are listed, but only abnormal results are displayed) Labs Reviewed  URINALYSIS, ROUTINE W REFLEX MICROSCOPIC - Abnormal; Notable for the following components:      Result Value   Color, Urine STRAW (*)    Specific Gravity, Urine 1.004 (*)    All other components within normal limits  HIV ANTIBODY (ROUTINE TESTING  W REFLEX)  RPR  GC/CHLAMYDIA PROBE AMP ([facility]) NOT AT [facility]    EKG None  Radiology No results found.  Procedures Procedures (including critical care time)  Medications Ordered in ED Medications  cefTRIAXone (ROCEPHIN) injection 250 mg (250 mg Intramuscular Given 05/04/19 2129)  azithromycin (ZITHROMAX) tablet 1,000 mg (1,000 mg Oral Given 05/04/19 2129)  lidocaine (PF) (XYLOCAINE) 1 % injection (  Given 05/04/19 2130)    ED Course  I have reviewed the triage vital signs and the nursing notes.  Pertinent labs & imaging results that were available during my care of the patient were reviewed by me and considered in my medical decision making (see chart for details).    MDM Rules/Calculators/A&P                      Patient presenting with concern for possible STD exposure. Pt is afebrile without abdominal tenderness, abdominal pain or painful bowel movements to indicate prostatitis.  No tenderness to palpation of the testicle or epididymis to suggest orchitis or epididymitis.  STD cultures obtained including HIV, syphilis, gonorrhea and chlamydia. Patient to be discharged with instructions to follow up with PCP. Discussed importance of using protection when sexually active. Pt understands that they have STD cultures pending and that they will need to inform all sexual partners if results return positive. Patient has been treated prophylactically with azithromycin and Rocephin.   Discussed results, findings, treatment and follow up. Patient advised of return precautions. Patient verbalized understanding and agreed with plan.  Final Clinical Impression(s) / ED Diagnoses Final diagnoses:  Possible exposure to STD    Rx / DC Orders ED Discharge Orders    None       Amandajo Gonder, [place] N, PA-C 05/04/19 2210    Jacalyn Lefevre, MD 05/04/19 2213

## 2019-05-05 LAB — RPR: RPR Ser Ql: NONREACTIVE

## 2019-05-08 LAB — GC/CHLAMYDIA PROBE AMP (~~LOC~~) NOT AT ARMC
Chlamydia: NEGATIVE
Neisseria Gonorrhea: NEGATIVE

## 2019-05-29 ENCOUNTER — Other Ambulatory Visit: Payer: Self-pay

## 2019-05-29 ENCOUNTER — Emergency Department (HOSPITAL_COMMUNITY)
Admission: EM | Admit: 2019-05-29 | Discharge: 2019-05-29 | Disposition: A | Discharge status: Home / Self Care | Payer: Self-pay | Attending: Emergency Medicine | Admitting: Emergency Medicine

## 2019-05-29 ENCOUNTER — Encounter (HOSPITAL_COMMUNITY): Payer: Self-pay | Admitting: Emergency Medicine

## 2019-05-29 DIAGNOSIS — Z202 Contact with and (suspected) exposure to infections with a predominantly sexual mode of transmission: Secondary | ICD-10-CM | POA: Insufficient documentation

## 2019-05-29 DIAGNOSIS — F17210 Nicotine dependence, cigarettes, uncomplicated: Secondary | ICD-10-CM | POA: Insufficient documentation

## 2019-05-29 LAB — HIV ANTIBODY (ROUTINE TESTING W REFLEX): HIV Screen 4th Generation wRfx: NONREACTIVE

## 2019-05-29 LAB — URINALYSIS, ROUTINE W REFLEX MICROSCOPIC
Bilirubin Urine: NEGATIVE
Glucose, UA: NEGATIVE mg/dL
Hgb urine dipstick: NEGATIVE
Ketones, ur: NEGATIVE mg/dL
Leukocytes,Ua: NEGATIVE
Nitrite: NEGATIVE
Protein, ur: NEGATIVE mg/dL
Specific Gravity, Urine: 1.008 (ref 1.005–1.030)
pH: 6 (ref 5.0–8.0)

## 2019-05-29 LAB — RPR: RPR Ser Ql: NONREACTIVE

## 2019-05-29 MED ORDER — LIDOCAINE HCL (PF) 1 % IJ SOLN
INTRAMUSCULAR | Status: AC
  Filled 2019-05-29: qty 30

## 2019-05-29 MED ORDER — CEFTRIAXONE SODIUM 1 G IJ SOLR
500.0000 mg | Freq: Once | INTRAMUSCULAR | Status: AC
  Administered 2019-05-29: 500 mg via INTRAMUSCULAR
  Filled 2019-05-29: qty 10

## 2019-05-29 MED ORDER — DOXYCYCLINE HYCLATE 100 MG PO CAPS: 100.0000 mg | ORAL_CAPSULE | Freq: Two times a day (BID) | ORAL | 0 refills | Status: DC

## 2019-05-29 NOTE — ED Provider Notes (Signed)
[name] DEPT Provider Note   CSN: [id number] Arrival date & time: 05/29/19  0541     History Chief Complaint  Patient presents with  . Exposure to STD    Rodney [name] is a 49 y.o. male with a hx of tobacco abuse who presents to the ED requesting evaluation & prophylaxis for STDs today. Patient received a call from his significant other stating that she had intercourse with someone else and had some type of STD. He states he was too upset and hung up before he could hear the specific STD. Other than being upset he is otherwise asymptomatic. No alleviating/aggravating factors. Denies fever, chills, nausea, vomiting, abdominal pain, dysuria, penile discharge, testicular pain/swelling, genital lesions, or pain with BMs.   HPI     History reviewed. No pertinent past medical history.  There are no problems to display for this patient.   Past Surgical History:  Procedure Laterality Date  . SURGERY SCROTAL / TESTICULAR Right        No family history on file.  Social History   Tobacco Use  . Smoking status: Current Every Day Smoker    Packs/day: 0.50    Types: Cigarettes  . Smokeless tobacco: Never Used  Substance Use Topics  . Alcohol use: No  . Drug use: Yes    Types: Marijuana    Comment: occ    Home Medications Prior to Admission medications   Medication Sig Start Date End Date Taking? Authorizing Provider  ciprofloxacin (CIPRO) 500 MG tablet Take 1 tablet (500 mg total) by mouth 2 (two) times daily. One po bid x 7 days 05/29/17   Veryl Speak, MD  diclofenac (CATAFLAM) 50 MG tablet Take 1 tablet (50 mg total) by mouth 2 (two) times daily. Patient not taking: Reported on 08/01/2438 02/14/24   Delora Fuel, MD  methocarbamol (ROBAXIN) 500 MG tablet Take 1 tablet (500 mg total) by mouth 2 (two) times daily. Patient not taking: Reported on 05/20/2017 07/18/14   Hyman Bible, PA-C  naproxen (NAPROSYN) 500 MG tablet Take 1 tablet (500  mg total) by mouth 2 (two) times daily. Patient not taking: Reported on 05/20/2017 04/09/16   Gloriann Loan, PA-C  oxyCODONE-acetaminophen (PERCOCET/ROXICET) 5-325 MG tablet Take 1 tablet by mouth every 4 (four) hours as needed for severe pain. Patient not taking: Reported on 3/66/4403 47/42/59   Delora Fuel, MD  traMADol (ULTRAM) 50 MG tablet Take 1 tablet (50 mg total) by mouth every 6 (six) hours as needed. Patient not taking: Reported on 05/20/2017 07/18/14   Hyman Bible, PA-C    Allergies    Patient has no known allergies.  Review of Systems   Review of Systems  Constitutional: Negative for chills and fever.  Gastrointestinal: Negative for abdominal pain, anal bleeding, diarrhea, nausea, rectal pain and vomiting.  Genitourinary: Negative for discharge, dysuria, frequency, genital sores, hematuria, penile pain, penile swelling, scrotal swelling, testicular pain and urgency.    Physical Exam Updated Vital Signs BP (!) 121/44 (BP Location: Left Arm)   Pulse 99   Temp 98.9 F (37.2 C) (Oral)   Resp 19   SpO2 100%   Physical Exam Vitals and nursing note reviewed.  Constitutional:      General: He is not in acute distress.    Appearance: He is well-developed.  HENT:     Head: Normocephalic and atraumatic.  Eyes:     General:        Right eye: No discharge.  Left eye: No discharge.     Conjunctiva/sclera: Conjunctivae normal.  Cardiovascular:     Rate and Rhythm: Normal rate and regular rhythm.  Pulmonary:     Effort: Pulmonary effort is normal.     Breath sounds: Normal breath sounds.  Abdominal:     General: There is no distension.     Palpations: Abdomen is soft.     Tenderness: There is no abdominal tenderness. There is no guarding or rebound.  Genitourinary:    Comments: Offered, patient deferred.  Neurological:     Mental Status: He is alert.     Comments: Clear speech.   Psychiatric:        Behavior: Behavior normal.        Thought Content: Thought  content normal.     ED Results / Procedures / Treatments   Labs (all labs ordered are listed, but only abnormal results are displayed) Labs Reviewed  URINALYSIS, ROUTINE W REFLEX MICROSCOPIC  RPR  HIV ANTIBODY (ROUTINE TESTING W REFLEX)  GC/CHLAMYDIA PROBE AMP ([place]) NOT AT [facility]    EKG None  Radiology No results found.  Procedures Procedures (including critical care time)  Medications Ordered in ED Medications  cefTRIAXone (ROCEPHIN) injection 500 mg (has no administration in time range)    ED Course  I have reviewed the triage vital signs and the nursing notes.  Pertinent labs & imaging results that were available during my care of the patient were reviewed by me and considered in my medical decision making (see chart for details).    MDM Rules/Calculators/A&P                      Patient presents to the ED s/p possible STD exposure requesting testing & treatment. Nontoxic, no apparent distress, vitals without significant abnormality. Asymptomatic. No history components to raise concern for orchitis, epididymitis, or prostatitis. UA w/o infection, no trich. HIV/RPR/GC/chlamydia results pending- patient elected for prophylaxis- will tx w/ 500 mg of IM ceftriaxone & 1 week of doxycycline. Discussed importance of protection when sexually active, informing all sexual partners if positive, and need for abstinence for 1 week s/p treatment to prevent spread/re-infection. I discussed results, treatment plan, need for follow-up, and return precautions with the patient. Provided opportunity for questions, patient confirmed understanding and is in agreement with plan.   Final Clinical Impression(s) / ED Diagnoses Final diagnoses:  Exposure to STD    Rx / DC Orders ED Discharge Orders         Ordered    doxycycline (VIBRAMYCIN) 100 MG capsule  2 times daily     05/29/19 0724           Cherly Anderson, PA-C 05/29/19 0727    Nira Conn, MD  05/29/19 [phone]

## 2019-05-29 NOTE — Discharge Instructions (Signed)
You were seen in the emergency department today due to concern for exposure to an STD.  Your urine did not show findings of trichomonas.  We have tested you for gonorrhea, chlamydia, HIV and syphilis.  We will call you if any of these results are positive.  You may also review your results in MyChart.  We are covering you for gonorrhea and chlamydia with antibiotics, we gave you a shot in the emergency department and are sending you home with doxycycline.  Please take doxycycline as prescribed.  You may develop abdominal discomfort or diarrhea from the antibiotic.  You may help offset this with probiotics which you can buy at the store (ask your pharmacist if unable to find) or get probiotics in the form of eating yogurt. Do not eat or take the probiotics until 2 hours after your antibiotic. If you are unable to tolerate these side effects follow-up with your primary care provider or return to the emergency department.   If you begin to experience any blistering, rashes, swelling, or difficulty breathing seek medical care for evaluation of potentially more serious side effects.   Please be aware that this medication may interact with other medications you are taking, please be sure to discuss your medication list with your pharmacist.  If both your gonorrhea and Chlamydia tests are negative you can discontinue the use of doxycycline.  If any tests are positive do not have intercourse of any kind for a minimum of 1 week to prevent potential reinfection/spread of infection. If positive you will need to inform all sexual partners.  Please use protection when sexually active.  Follow-up with the health department in 1 week.  Return to the ER for new or worsening symptoms including but not limited to fever, abdominal pain, pain in the testicles, swelling in the testicles, pain with bowel movements, or any other concerns.

## 2019-05-29 NOTE — ED Triage Notes (Signed)
Patient here from home stating that friend tested positive for STD, unsure of which one.

## 2021-04-27 ENCOUNTER — Encounter (HOSPITAL_BASED_OUTPATIENT_CLINIC_OR_DEPARTMENT_OTHER): Payer: Self-pay | Admitting: Emergency Medicine

## 2021-04-27 ENCOUNTER — Emergency Department (HOSPITAL_BASED_OUTPATIENT_CLINIC_OR_DEPARTMENT_OTHER)
Admission: EM | Admit: 2021-04-27 | Discharge: 2021-04-27 | Disposition: A | Discharge status: Home / Self Care | Payer: Self-pay | Attending: Emergency Medicine | Admitting: Emergency Medicine

## 2021-04-27 DIAGNOSIS — M25511 Pain in right shoulder: Secondary | ICD-10-CM | POA: Insufficient documentation

## 2021-04-27 DIAGNOSIS — M62830 Muscle spasm of back: Secondary | ICD-10-CM | POA: Insufficient documentation

## 2021-04-27 DIAGNOSIS — M545 Low back pain, unspecified: Secondary | ICD-10-CM | POA: Insufficient documentation

## 2021-04-27 MED ORDER — LIDOCAINE 5 % EX PTCH
1.0000 | MEDICATED_PATCH | Freq: Once | CUTANEOUS | Status: DC
  Administered 2021-04-27: 1 via TRANSDERMAL
  Filled 2021-04-27: qty 1

## 2021-04-27 MED ORDER — IBUPROFEN 800 MG PO TABS
800.0000 mg | ORAL_TABLET | Freq: Once | ORAL | Status: AC
  Administered 2021-04-27: 800 mg via ORAL
  Filled 2021-04-27: qty 1

## 2021-04-27 MED ORDER — HYDROCODONE-ACETAMINOPHEN 5-325 MG PO TABS
1.0000 | ORAL_TABLET | Freq: Once | ORAL | Status: AC
  Administered 2021-04-27: 1 via ORAL
  Filled 2021-04-27: qty 1

## 2021-04-27 MED ORDER — METHOCARBAMOL 500 MG PO TABS
750.0000 mg | ORAL_TABLET | Freq: Once | ORAL | Status: AC
  Administered 2021-04-27: 750 mg via ORAL
  Filled 2021-04-27: qty 2

## 2021-04-27 MED ORDER — METHOCARBAMOL 500 MG PO TABS: 500.0000 mg | ORAL_TABLET | Freq: Two times a day (BID) | ORAL | 0 refills | Status: DC

## 2021-04-27 NOTE — ED Triage Notes (Signed)
Pt arrives pov, steady gait to room with c/o neck and back pain with spasms x 4 days. Pt denies injury

## 2021-04-27 NOTE — Discharge Instructions (Addendum)
It was a pleasure taking care of you today!   You are prescribed a muscle relaxer called, Robaxin. Take as prescribed.  Do not operate any heavy machinery or drive while taking Robaxin.  You may take over-the-counter 600 mg ibuprofen every 6 hours as needed for pain.  You may use over the counter lidocaine patches and place them on your affected areas. Make sure that you remove the patch every 12 hours and then replace it with a new patch. You may apply heat to the affected area for up to 15 minutes at a time.  Ensure to place a barrier between your skin and the heat. Return to the Emergency Department if you are experiencing loss of bowel or bladder, increasing/worsening symptoms, fever, inability to walk.

## 2021-04-27 NOTE — ED Provider Notes (Signed)
[facility] EMERGENCY DEPARTMENT Provider Note   CSN: [id number] Arrival date & time: 04/27/21  1015     History  Chief Complaint  Patient presents with   Back Pain    Rodney [name] is a 51 y.o. male who presents to the Emergency Department complaining of nonradiating lower back pain onset 4-5 days.  He notes that his back pain feels like it has spasms.  He has associated right trapezius spasm. He tried Tylenol and IcyHot with no relief in symptoms. Pt denies bowel/bladder incontinence, fever, chills, abdominal pain, n/v/d/c, hematuria, dysuria, numbness, tingling, weakness. Denies CA or IV drug use. Denies allergies to medications. Denies injury, trauma, heavy lifting.  He notes that he works at [employer].   The history is provided by the patient. No language interpreter was used.      Home Medications Prior to Admission medications   Medication Sig Start Date End Date Taking? Authorizing Provider  methocarbamol (ROBAXIN) 500 MG tablet Take 1 tablet (500 mg total) by mouth 2 (two) times daily. 04/27/21  Yes Joey Lierman A, PA-C  ciprofloxacin (CIPRO) 500 MG tablet Take 1 tablet (500 mg total) by mouth 2 (two) times daily. One po bid x 7 days 05/29/17   Veryl Speak, MD  doxycycline (VIBRAMYCIN) 100 MG capsule Take 1 capsule (100 mg total) by mouth 2 (two) times daily. 05/29/19   Petrucelli, Glynda Jaeger, PA-C      Allergies    Patient has no known allergies.    Review of Systems   Review of Systems  Constitutional:  Negative for chills and fever.  Respiratory:  Negative for cough.   Gastrointestinal:  Negative for abdominal pain, nausea and vomiting.  Genitourinary:  Negative for dysuria and hematuria.  Musculoskeletal:  Positive for back pain. Negative for gait problem.  Skin:  Negative for rash.  Neurological:  Negative for weakness and numbness.  All other systems reviewed and are negative.  Physical Exam Updated Vital Signs BP 113/70 (BP Location: Right Arm)     Pulse 72    Temp 97.6 F (36.4 C) (Oral)    Resp 20    Ht 5\' 11"  (1.803 m)    Wt 76.2 kg    SpO2 100%    BMI 23.43 kg/m  Physical Exam Vitals and nursing note reviewed.  Constitutional:      General: He is not in acute distress.    Appearance: He is not diaphoretic.  HENT:     Head: Normocephalic and atraumatic.     Mouth/Throat:     Pharynx: No oropharyngeal exudate.  Eyes:     General: No scleral icterus.    Conjunctiva/sclera: Conjunctivae normal.  Cardiovascular:     Rate and Rhythm: Normal rate and regular rhythm.     Pulses: Normal pulses.     Heart sounds: Normal heart sounds.  Pulmonary:     Effort: Pulmonary effort is normal. No respiratory distress.     Breath sounds: Normal breath sounds. No wheezing.  Abdominal:     General: Bowel sounds are normal.     Palpations: Abdomen is soft. There is no mass.     Tenderness: There is no abdominal tenderness. There is no guarding or rebound.  Musculoskeletal:        General: Normal range of motion.     Right shoulder: Tenderness present. No bony tenderness. Normal range of motion.     Left shoulder: No tenderness or bony tenderness. Normal range of motion.  Cervical back: Normal, normal range of motion and neck supple.     Thoracic back: Normal.     Lumbar back: Tenderness present. No bony tenderness. Negative right straight leg raise test and negative left straight leg raise test.       Back:     Comments: No C, T, L, S spinal TTP.  Mild TTP to lumbar musculature.  Mild tenderness to palpation to right trapezius.  No overlying skin changes.  Negative straight leg raise bilaterally.  Able to ambulate without assistance of difficulty. Strength and sensation intact to bilateral upper and lower extremities.  Skin:    General: Skin is warm and dry.  Neurological:     Mental Status: He is alert.  Psychiatric:        Behavior: Behavior normal.    ED Results / Procedures / Treatments   Labs (all labs ordered are listed, but  only abnormal results are displayed) Labs Reviewed - No data to display  EKG None  Radiology No results found.  Procedures Procedures    Medications Ordered in ED Medications  lidocaine (LIDODERM) 5 % 1 patch (1 patch Transdermal Patch Applied 04/27/21 1053)  methocarbamol (ROBAXIN) tablet 750 mg (750 mg Oral Given 04/27/21 1054)  ibuprofen (ADVIL) tablet 800 mg (800 mg Oral Given 04/27/21 1054)  HYDROcodone-acetaminophen (NORCO/VICODIN) 5-325 MG per tablet 1 tablet (1 tablet Oral Given 04/27/21 1152)    ED Course/ Medical Decision Making/ A&P Clinical Course as of 04/27/21 1225  Sun Apr 27, 2021  1115 Re-evaluated and noted mild improvement of symptoms with treatment regimen. Discussed discharge treatment plan. Pt agreeable at this time. Pt appears safe for discharge. [SB]    Clinical Course User Index [SB] Amahri Dengel A, PA-C                           Medical Decision Making  Patient with bilateral lumbar back pain without radiation.  Patient also has right trapezius pain.  The symptoms of been ongoing for 4-5 days.  Patient denies history of sciatica.  Denies recent injury, trauma, fall. No neurological deficits and normal neuro exam.  Patient is ambulatory without assistance or difficulty. No loss of bowel or bladder control.  No concern for cauda equina.  No fever, night sweats, weight loss, h/o cancer, IVDA, no recent procedure to back. No urinary symptoms suggestive of UTI.  On exam, patient with mild tenderness to palpation to bilateral lumbar musculature.  Mild tenderness to palpation to right trapezius.  No spinal tenderness to palpation. No concerning findings on exams. Differential diagnosis includes but is not limited to fracture, muscle strain, cauda equina, kidney stone,   No imaging indicated at this time due to absence of red flag symptoms. Patient given ibuprofen, robaxin, norco, and lidoderm patch while in ED with mild improvement of symptoms.   Medications:  I  ordered medication including Ibuprofen, norco, Robaxin, and lidoderm patch for back spasms/pain Reevaluation of the patient after these medicines showed that the patient improved I have reviewed the patients home medicines and have made adjustments as needed  Reevaluation: After the interventions noted above, I reevaluated the patient and found that they have :improved   Disposition: Patient presentation suspicious for muscle spasms. After consideration of the diagnostic results and the patients response to treatment, I feel that the patent would benefit from discharge home with Robaxin, ibuprofen, over-the-counter lidocaine patches. Work note provided to patient. Supportive care measures and strict return  precautions discussed with patient at bedside. Pt acknowledges and verbalizes understanding. Pt appears safe for discharge. Follow up as indicated in discharge paperwork.    This chart was dictated using voice recognition software, Dragon. Despite the best efforts of this provider to proofread and correct errors, errors may still occur which can change documentation meaning.  Final Clinical Impression(s) / ED Diagnoses Final diagnoses:  Acute bilateral low back pain without sciatica  Muscle spasm of back    Rx / DC Orders ED Discharge Orders          Ordered    methocarbamol (ROBAXIN) 500 MG tablet  2 times daily        04/27/21 1144              Dyami Umbach A, PA-C 04/27/21 1226    Gareth Morgan, MD 04/28/21 0730

## 2021-05-01 ENCOUNTER — Other Ambulatory Visit (HOSPITAL_BASED_OUTPATIENT_CLINIC_OR_DEPARTMENT_OTHER): Payer: Self-pay

## 2021-05-01 ENCOUNTER — Emergency Department (HOSPITAL_BASED_OUTPATIENT_CLINIC_OR_DEPARTMENT_OTHER)
Admission: EM | Admit: 2021-05-01 | Discharge: 2021-05-01 | Disposition: A | Discharge status: Home / Self Care | Payer: Self-pay | Attending: Emergency Medicine | Admitting: Emergency Medicine

## 2021-05-01 ENCOUNTER — Encounter (HOSPITAL_BASED_OUTPATIENT_CLINIC_OR_DEPARTMENT_OTHER): Payer: Self-pay

## 2021-05-01 ENCOUNTER — Other Ambulatory Visit: Payer: Self-pay

## 2021-05-01 DIAGNOSIS — X500XXA Overexertion from strenuous movement or load, initial encounter: Secondary | ICD-10-CM | POA: Insufficient documentation

## 2021-05-01 DIAGNOSIS — M25511 Pain in right shoulder: Secondary | ICD-10-CM | POA: Insufficient documentation

## 2021-05-01 MED ORDER — KETOROLAC TROMETHAMINE 60 MG/2ML IM SOLN
60.0000 mg | Freq: Once | INTRAMUSCULAR | Status: AC
  Administered 2021-05-01: 60 mg via INTRAMUSCULAR
  Filled 2021-05-01: qty 2

## 2021-05-01 MED ORDER — METHYLPREDNISOLONE 4 MG PO TBPK
ORAL_TABLET | ORAL | 0 refills | Status: DC
  Filled 2021-05-01: qty 21, 6d supply, fill #0

## 2021-05-01 NOTE — ED Triage Notes (Signed)
Pt reports worsening right shoulder pain. States he was recently seen for the same. Denies injury

## 2021-05-01 NOTE — ED Provider Notes (Signed)
[facility] EMERGENCY DEPARTMENT Provider Note   CSN: [id number] Arrival date & time: 05/01/21  8527     History  Chief Complaint  Patient presents with   Shoulder Pain    Rodney [name] is a 51 y.o. male.  Here for acute on chronic right shoulder pain.  No specific trauma.  Repetitive action at work with lifting.  Denies any weakness, numbness, tingling of the upper extremities.  No neck pain.  The history is provided by the patient.  Shoulder Pain Location:  Shoulder Shoulder location:  R shoulder Injury: no   Pain details:    Quality:  Aching   Severity:  Mild   Onset quality:  Gradual   Timing:  Intermittent   Progression:  Waxing and waning Relieved by:  Nothing Worsened by:  Nothing Associated symptoms: stiffness   Associated symptoms: no back pain, no fatigue, no fever, no muscle weakness, no neck pain and no numbness       Home Medications Prior to Admission medications   Medication Sig Start Date End Date Taking? Authorizing Provider  methylPREDNISolone (MEDROL DOSEPAK) 4 MG TBPK tablet Follow package insert 05/01/21  Yes Zynia Wojtowicz, DO  ciprofloxacin (CIPRO) 500 MG tablet Take 1 tablet (500 mg total) by mouth 2 (two) times daily. One po bid x 7 days 05/29/17   Geoffery Lyons, MD  doxycycline (VIBRAMYCIN) 100 MG capsule Take 1 capsule (100 mg total) by mouth 2 (two) times daily. 05/29/19   Petrucelli, Samantha R, PA-C  methocarbamol (ROBAXIN) 500 MG tablet Take 1 tablet (500 mg total) by mouth 2 (two) times daily. 04/27/21   Blue, Soijett A, PA-C      Allergies    Patient has no known allergies.    Review of Systems   Review of Systems  Constitutional:  Negative for fatigue and fever.  Musculoskeletal:  Positive for stiffness. Negative for back pain and neck pain.   Physical Exam Updated Vital Signs BP 110/70    Pulse 69    Temp (!) 97.5 F (36.4 C) (Oral)    Resp 17    SpO2 100%  Physical Exam Constitutional:      General: Rodney [name] is not in acute  distress.    Appearance: Rodney [name] is not ill-appearing.  Cardiovascular:     Rate and Rhythm: Normal rate.     Pulses: Normal pulses.     Heart sounds: Normal heart sounds.  Musculoskeletal:        General: Tenderness present. No swelling or deformity. Normal range of motion.     Cervical back: Normal range of motion. No tenderness.     Comments: Tenderness to the anterior portion of the right shoulder, right bicipital groove, no obvious deformity, right shoulder appears located  Skin:    General: Skin is warm.  Neurological:     General: No focal deficit present.     Mental Status: Rodney [name] is alert.     Sensory: No sensory deficit.     Motor: No weakness.    ED Results / Procedures / Treatments   Labs (all labs ordered are listed, but only abnormal results are displayed) Labs Reviewed - No data to display  EKG None  Radiology No results found.  Procedures Procedures    Medications Ordered in ED Medications  ketorolac (TORADOL) injection 60 mg (has no administration in time range)    ED Course/ Medical Decision Making/ A&P  Medical Decision Making  TREAVOR BLOMQUIST is here for acute on chronic right shoulder pain.  Normal vitals.  No fever.  Right shoulder pain for the last several days.  Does manual labor for work.  Tenderness in the right bicipital groove, right shoulder consistent likely with sprain or tendinitis or overuse process.  Differential includes sprain/tendinitis versus musculoskeletal process.  I have no concern for fracture given no trauma history.  No concern for septic joint.  Rodney [name] has no fever.  Neurovascular exam is normal.  Neurologic exam is normal.  Rodney [name] has good strength and sensation in his upper extremity.  No concern for vascular process.  Will prescribe him Medrol Dosepak.  Recommend Motrin.  We will place him in a sling.  We will have him follow-up with sports medicine.  Discharged in good condition.  This chart was dictated using  voice recognition software.  Despite best efforts to proofread,  errors can occur which can change the documentation meaning.         Final Clinical Impression(s) / ED Diagnoses Final diagnoses:  Acute pain of right shoulder    Rx / DC Orders ED Discharge Orders          Ordered    methylPREDNISolone (MEDROL DOSEPAK) 4 MG TBPK tablet        05/01/21 0942              Virgina Norfolk, DO 05/01/21 0945

## 2021-05-01 NOTE — Discharge Instructions (Signed)
Recommend 800 mg ibuprofen every 8 hours as needed for pain.  Take Medrol Dosepak as prescribed.  Follow-up with sports medicine.  Use sling for comfort.  However, recommend that you take the sling off several times a day and do some range of motion movements of your shoulder.

## 2021-08-16 ENCOUNTER — Emergency Department (HOSPITAL_BASED_OUTPATIENT_CLINIC_OR_DEPARTMENT_OTHER)
Admission: EM | Admit: 2021-08-16 | Discharge: 2021-08-16 | Disposition: A | Discharge status: Home / Self Care | Payer: Self-pay | Attending: Emergency Medicine | Admitting: Emergency Medicine

## 2021-08-16 ENCOUNTER — Encounter (HOSPITAL_BASED_OUTPATIENT_CLINIC_OR_DEPARTMENT_OTHER): Payer: Self-pay | Admitting: Emergency Medicine

## 2021-08-16 ENCOUNTER — Other Ambulatory Visit: Payer: Self-pay

## 2021-08-16 DIAGNOSIS — Z202 Contact with and (suspected) exposure to infections with a predominantly sexual mode of transmission: Secondary | ICD-10-CM | POA: Insufficient documentation

## 2021-08-16 LAB — URINALYSIS, ROUTINE W REFLEX MICROSCOPIC
Bilirubin Urine: NEGATIVE
Glucose, UA: NEGATIVE mg/dL
Hgb urine dipstick: NEGATIVE
Ketones, ur: NEGATIVE mg/dL
Leukocytes,Ua: NEGATIVE
Nitrite: NEGATIVE
Protein, ur: NEGATIVE mg/dL
Specific Gravity, Urine: 1.025 (ref 1.005–1.030)
pH: 6 (ref 5.0–8.0)

## 2021-08-16 MED ORDER — METRONIDAZOLE 500 MG PO TABS: 500.0000 mg | ORAL_TABLET | Freq: Two times a day (BID) | ORAL | 0 refills | Status: DC

## 2021-08-16 NOTE — ED Triage Notes (Signed)
Patient arrived via POV c/o std exposure x 1 week. Patient states he was told he was exposed to trich. Patient is AO x 4, VS WDL, normal gait. ?

## 2021-08-16 NOTE — ED Provider Notes (Signed)
?  [facility] EMERGENCY DEPARTMENT ?Provider Note ? ? ?CSN: [id number] ?Arrival date & time: 08/16/21  0228 ? ?  ? ?History ? ?Chief Complaint  ?Patient presents with  ? Exposure to STD  ? ? ?Rodney [name] is a 51 y.o. male. ? ? ?Exposure to STD ? ?Patient reports one of his sexual partners called him and told him that she had trichomonas.  Exposure was 1 week ago.  He reports he feels different but is unable to quantify ?No fevers or vomiting.  No penile discharge.  No dysuria. ?  ? ?Home Medications ?Prior to Admission medications   ?Medication Sig Start Date End Date Taking? Authorizing Provider  ?metroNIDAZOLE (FLAGYL) 500 MG tablet Take 1 tablet (500 mg total) by mouth 2 (two) times daily. 08/16/21  Yes Zadie Rhine, MD  ?   ? ?Allergies    ?Patient has no known allergies.   ? ?Review of Systems   ?Review of Systems  ?Constitutional:  Negative for fever.  ? ?Physical Exam ?Updated Vital Signs ?BP 138/78 (BP Location: Right Arm)   Pulse 69   Temp 98.1 ?F (36.7 ?C) (Oral)   Resp 18   Ht 1.803 m (5\' 11" )   Wt 76.2 kg   SpO2 100%   BMI 23.43 kg/m?  ?Physical Exam ?CONSTITUTIONAL: Well developed/well nourished ?HEAD: Normocephalic/atraumatic ?EYES: EOMI ?GU exam deferred ?NEURO: Pt is awake/alert/appropriate, moves all extremitiesx4.  No facial droop.   ?EXTREMITIES:full ROM ?SKIN: warm, color normal ?PSYCH: no abnormalities of mood noted, alert and oriented to situation ? ?ED Results / Procedures / Treatments   ?Labs ?(all labs ordered are listed, but only abnormal results are displayed) ?Labs Reviewed  ?URINALYSIS, ROUTINE W REFLEX MICROSCOPIC  ?GC/CHLAMYDIA PROBE AMP ([place]) NOT AT [facility]  ? ? ?EKG ?None ? ?Radiology ?No results found. ? ?Procedures ?Procedures  ? ? ?Medications Ordered in ED ?Medications - No data to display ? ?ED Course/ Medical Decision Making/ A&P ?  ?                        ?Medical Decision Making ?Amount and/or Complexity of Data Reviewed ?Labs:  ordered. ? ?Risk ?Prescription drug management. ? ? ?Empirically treat for trichomonas.  Discussed need to avoid alcohol.  Safe sex practices discussed w/patient ? ? ? ? ? ? ? ?Final Clinical Impression(s) / ED Diagnoses ?Final diagnoses:  ?STD exposure  ? ? ?Rx / DC Orders ?ED Discharge Orders   ? ?      Ordered  ?  metroNIDAZOLE (FLAGYL) 500 MG tablet  2 times daily       ? 08/16/21 0427  ? ?  ?  ? ?  ? ? ?  ?08/18/21, MD ?08/16/21 [phone] ? ?

## 2021-08-19 LAB — GC/CHLAMYDIA PROBE AMP (~~LOC~~) NOT AT ARMC
Chlamydia: NEGATIVE
Comment: NEGATIVE
Comment: NORMAL
Neisseria Gonorrhea: NEGATIVE

## 2021-08-30 ENCOUNTER — Emergency Department (HOSPITAL_COMMUNITY): Admission: EM | Admit: 2021-08-30 | Discharge: 2021-08-30 | Discharge status: Against Medical Advice | Payer: Self-pay

## 2021-08-30 ENCOUNTER — Other Ambulatory Visit: Payer: Self-pay

## 2021-08-30 NOTE — ED Notes (Signed)
Patient called for triage w/ no answer x3. LWBS dragging OTF. ?

## 2021-09-01 ENCOUNTER — Emergency Department
Admission: EM | Admit: 2021-09-01 | Discharge: 2021-09-01 | Disposition: A | Discharge status: Home / Self Care | Payer: Self-pay | Attending: Emergency Medicine | Admitting: Emergency Medicine

## 2021-09-01 ENCOUNTER — Other Ambulatory Visit: Payer: Self-pay

## 2021-09-01 DIAGNOSIS — Z711 Person with feared health complaint in whom no diagnosis is made: Secondary | ICD-10-CM

## 2021-09-01 DIAGNOSIS — F17200 Nicotine dependence, unspecified, uncomplicated: Secondary | ICD-10-CM | POA: Insufficient documentation

## 2021-09-01 DIAGNOSIS — Z202 Contact with and (suspected) exposure to infections with a predominantly sexual mode of transmission: Secondary | ICD-10-CM | POA: Insufficient documentation

## 2021-09-01 LAB — URINALYSIS, COMPLETE (UACMP) WITH MICROSCOPIC
Bacteria, UA: NONE SEEN
Bilirubin Urine: NEGATIVE
Glucose, UA: NEGATIVE mg/dL
Hgb urine dipstick: NEGATIVE
Ketones, ur: NEGATIVE mg/dL
Leukocytes,Ua: NEGATIVE
Nitrite: NEGATIVE
Protein, ur: NEGATIVE mg/dL
Specific Gravity, Urine: 1.003 — ABNORMAL LOW (ref 1.005–1.030)
Squamous Epithelial / HPF: NONE SEEN (ref 0–5)
WBC, UA: NONE SEEN WBC/hpf (ref 0–5)
pH: 6 (ref 5.0–8.0)

## 2021-09-01 LAB — CHLAMYDIA/NGC RT PCR (ARMC ONLY)
Chlamydia Tr: NOT DETECTED
N gonorrhoeae: NOT DETECTED

## 2021-09-01 NOTE — ED Notes (Signed)
See triage note  presents with possible STD  denies any penile discharge but feels different ?

## 2021-09-01 NOTE — Discharge Instructions (Signed)
Currently your urine is negative for infection so there is a low suspicion for gonorrhea and chlamydia however your test will not show results for approximately 6 to 8 hours.  If these are positive you will get a phone call.  If any continued concerns or desire to be tested for other STDs you can call make an appointment at the [facility] department and their contact information was listed with your discharge papers. ?

## 2021-09-01 NOTE — ED Provider Triage Note (Signed)
Emergency Medicine Provider Triage Evaluation Note ? ?Rodney [name] , a 51 y.o. male  was evaluated in triage.  Pt complains of "feeling weird" after having unprotected sex, is "OCD" and wants to get checked out. Denies pain.. ? ?Review of Systems  ?Positive:  ?Negative:  ? ?Physical Exam  ?BP 114/72 (BP Location: Left Arm)   Pulse 60   Temp 98 ?F (36.7 ?C) (Oral)   Resp 16   Ht 6' (1.829 m)   Wt 79.4 kg   SpO2 99%   BMI 23.73 kg/m?  ?Gen:   Awake, no distress   ?Resp:  Normal effort  ?MSK:   Moves extremities without difficulty  ?Other:   ? ?Medical Decision Making  ?Medically screening exam initiated at 7:29 AM.  Appropriate orders placed.  Rodney [name] was informed that the remainder of the evaluation will be completed by another provider, this initial triage assessment does not replace that evaluation, and the importance of remaining in the ED until their evaluation is complete. ? ? ?  ?Delton Prairie, MD ?09/01/21 0730 ? ?

## 2021-09-01 NOTE — ED Provider Notes (Signed)
? ?[facility] ?Provider Note ? ? ? Event Date/Time  ? First MD Initiated Contact with Patient 09/01/21 [phone]   ?  (approximate) ? ? ?History  ? ?Exposure to STD ? ? ?HPI ? ?Rodney [name] is a 51 y.o. male   presents to the ED with concerns of possible STD.  Patient states that he had unprotected sex 3 days ago" feels different".  Patient denies any dysuria, rash or discharge.  Patient denies fever, chills, previous urinary tract infections.  Negative for medical history and patient is 1/2 pack a day smoker. ? ?  ? ? ?Physical Exam  ? ?Triage Vital Signs: ?ED Triage Vitals [09/01/21 0722]  ?Enc Vitals Group  ?   BP 114/72  ?   Pulse Rate 60  ?   Resp 16  ?   Temp 98 ?F (36.7 ?C)  ?   Temp Source Oral  ?   SpO2 99 %  ?   Weight 175 lb (79.4 kg)  ?   Height 6' (1.829 m)  ?   Head Circumference   ?   Peak Flow   ?   Pain Score 0  ?   Pain Loc   ?   Pain Edu?   ?   Excl. in GC?   ? ? ?Most recent vital signs: ?Vitals:  ? 09/01/21 0722  ?BP: 114/72  ?Pulse: 60  ?Resp: 16  ?Temp: 98 ?F (36.7 ?C)  ?SpO2: 99%  ? ? ? ?General: Awake, no distress.  ?CV:  Good peripheral perfusion.  ?Resp:  Normal effort.  ?Abd:  No distention.  ?Other:   ? ? ?ED Results / Procedures / Treatments  ? ?Labs ?(all labs ordered are listed, but only abnormal results are displayed) ?Labs Reviewed  ?URINALYSIS, COMPLETE (UACMP) WITH MICROSCOPIC - Abnormal; Notable for the following components:  ?    Result Value  ? Color, Urine STRAW (*)   ? APPearance CLEAR (*)   ? Specific Gravity, Urine 1.003 (*)   ? All other components within normal limits  ?CHLAMYDIA/NGC RT PCR ([facility] ONLY)            ? ? ? ? ? ?PROCEDURES: ? ?Critical Care performed:  ? ?Procedures ? ? ?MEDICATIONS ORDERED IN ED: ?Medications - No data to display ? ? ?IMPRESSION / MDM / ASSESSMENT AND PLAN / ED COURSE  ?I reviewed the triage vital signs and the nursing notes. ? ? ?Differential diagnosis includes, but is not limited to, urethritis, STD, fear of STD, urinary  tract infection. ? ?51 year old male presents to the ED with concerns of possible STD exposure.  Patient states that he he had unprotected sex 3 days ago.  He denies any discharge or urinary frequency.  Urine Alysis was negative and patient was made aware that his STD test would take several hours.  He will be called on the results especially if it is positive.  He was also given information about the STD clinic at the [facility] department if he continues to have any other concerns. ? ?----------------------------------------- ?12:11 PM on 09/01/2021 ?----------------------------------------- ?Patient was called personally by myself and was told that his GC and Chlamydia test are negative. ? ?  ? ? ?FINAL CLINICAL IMPRESSION(S) / ED DIAGNOSES  ? ?Final diagnoses:  ?Concern about STD in male without diagnosis  ? ? ? ?Rx / DC Orders  ? ?ED Discharge Orders   ? ? None  ? ?  ? ? ? ?Note:  This document was prepared using Dragon voice recognition software and may include unintentional dictation errors. ?  ?Tommi Rumps, PA-C ?09/01/21 1211 ? ?  ?Sharman Cheek, MD ?09/01/21 1526 ? ?

## 2021-09-01 NOTE — ED Triage Notes (Signed)
Pt states he "feels different " after having sexual intercourse and wants to be checked out., denies any painful urination or other sx at this time ?

## 2021-12-12 ENCOUNTER — Emergency Department (HOSPITAL_BASED_OUTPATIENT_CLINIC_OR_DEPARTMENT_OTHER)
Admission: EM | Admit: 2021-12-12 | Discharge: 2021-12-12 | Disposition: A | Discharge status: Home / Self Care | Payer: Self-pay | Attending: Emergency Medicine | Admitting: Emergency Medicine

## 2021-12-12 ENCOUNTER — Other Ambulatory Visit: Payer: Self-pay

## 2021-12-12 ENCOUNTER — Encounter (HOSPITAL_BASED_OUTPATIENT_CLINIC_OR_DEPARTMENT_OTHER): Payer: Self-pay | Admitting: Emergency Medicine

## 2021-12-12 DIAGNOSIS — R197 Diarrhea, unspecified: Secondary | ICD-10-CM | POA: Insufficient documentation

## 2021-12-12 DIAGNOSIS — N179 Acute kidney failure, unspecified: Secondary | ICD-10-CM

## 2021-12-12 DIAGNOSIS — R112 Nausea with vomiting, unspecified: Secondary | ICD-10-CM | POA: Insufficient documentation

## 2021-12-12 DIAGNOSIS — E876 Hypokalemia: Secondary | ICD-10-CM

## 2021-12-12 DIAGNOSIS — R109 Unspecified abdominal pain: Secondary | ICD-10-CM | POA: Insufficient documentation

## 2021-12-12 LAB — CBC WITH DIFFERENTIAL/PLATELET
Abs Immature Granulocytes: 0.01 10*3/uL (ref 0.00–0.07)
Basophils Absolute: 0 10*3/uL (ref 0.0–0.1)
Basophils Relative: 0 %
Eosinophils Absolute: 0.1 10*3/uL (ref 0.0–0.5)
Eosinophils Relative: 2 %
HCT: 44.5 % (ref 39.0–52.0)
Hemoglobin: 15.3 g/dL (ref 13.0–17.0)
Immature Granulocytes: 0 %
Lymphocytes Relative: 33 %
Lymphs Abs: 2 10*3/uL (ref 0.7–4.0)
MCH: 30.9 pg (ref 26.0–34.0)
MCHC: 34.4 g/dL (ref 30.0–36.0)
MCV: 89.9 fL (ref 80.0–100.0)
Monocytes Absolute: 0.8 10*3/uL (ref 0.1–1.0)
Monocytes Relative: 13 %
Neutro Abs: 3.2 10*3/uL (ref 1.7–7.7)
Neutrophils Relative %: 52 %
Platelets: 164 10*3/uL (ref 150–400)
RBC: 4.95 MIL/uL (ref 4.22–5.81)
RDW: 12.9 % (ref 11.5–15.5)
WBC: 6.1 10*3/uL (ref 4.0–10.5)
nRBC: 0 % (ref 0.0–0.2)

## 2021-12-12 LAB — COMPREHENSIVE METABOLIC PANEL
ALT: 12 U/L (ref 0–44)
AST: 23 U/L (ref 15–41)
Albumin: 3.9 g/dL (ref 3.5–5.0)
Alkaline Phosphatase: 59 U/L (ref 38–126)
Anion gap: 5 (ref 5–15)
BUN: 12 mg/dL (ref 6–20)
CO2: 23 mmol/L (ref 22–32)
Calcium: 8.6 mg/dL — ABNORMAL LOW (ref 8.9–10.3)
Chloride: 109 mmol/L (ref 98–111)
Creatinine, Ser: 1.27 mg/dL — ABNORMAL HIGH (ref 0.61–1.24)
GFR, Estimated: >60 mL/min (ref >60)
Glucose, Bld: 136 mg/dL — ABNORMAL HIGH (ref 70–99)
Potassium: 3.1 mmol/L — ABNORMAL LOW (ref 3.5–5.1)
Sodium: 137 mmol/L (ref 135–145)
Total Bilirubin: 0.7 mg/dL (ref 0.3–1.2)
Total Protein: 6.8 g/dL (ref 6.5–8.1)

## 2021-12-12 LAB — CK: Total CK: 469 U/L — ABNORMAL HIGH (ref 49–397)

## 2021-12-12 MED ORDER — ACETAMINOPHEN 325 MG PO TABS
650.0000 mg | ORAL_TABLET | Freq: Once | ORAL | Status: AC
  Administered 2021-12-12: 650 mg via ORAL
  Filled 2021-12-12: qty 2

## 2021-12-12 MED ORDER — SODIUM CHLORIDE 0.9 % IV BOLUS
1000.0000 mL | Freq: Once | INTRAVENOUS | Status: AC
  Administered 2021-12-12: 1000 mL via INTRAVENOUS

## 2021-12-12 MED ORDER — POTASSIUM CHLORIDE CRYS ER 20 MEQ PO TBCR
40.0000 meq | EXTENDED_RELEASE_TABLET | Freq: Once | ORAL | Status: AC
  Administered 2021-12-12: 40 meq via ORAL
  Filled 2021-12-12: qty 2

## 2021-12-12 MED ORDER — ONDANSETRON HCL 4 MG/2ML IJ SOLN
4.0000 mg | Freq: Once | INTRAMUSCULAR | Status: AC
  Administered 2021-12-12: 4 mg via INTRAVENOUS
  Filled 2021-12-12: qty 2

## 2021-12-12 MED ORDER — POTASSIUM CHLORIDE ER 10 MEQ PO TBCR: 10.0000 meq | EXTENDED_RELEASE_TABLET | Freq: Every day | ORAL | 0 refills | Status: DC

## 2021-12-12 MED ORDER — ONDANSETRON HCL 4 MG PO TABS: 4.0000 mg | ORAL_TABLET | Freq: Four times a day (QID) | ORAL | 0 refills | Status: DC

## 2021-12-12 NOTE — ED Provider Notes (Signed)
[facility] EMERGENCY DEPARTMENT Provider Note   CSN: [id number] Arrival date & time: 12/12/21  2694     History  Chief Complaint  Patient presents with   Abdominal Pain    Rodney [name] is a 51 y.o. male.  Patient is a 51 year old male with no significant past medical history presenting to the emergency department with abdominal pain, nausea, vomiting and diarrhea.  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  He states he got home from work around 3 PM and started to develop lower abdominal cramping.  He states throughout the night he had increasing nausea and vomiting that woke him up from sleep as well as several episodes of watery diarrhea.  He denies any black or bloody stools.  He states that he is having muscle cramps diffusely in his arms and legs.  He denies any fevers or chills, dysuria or hematuria.  He states that he feels lightheaded and dizzy upon standing.  He denies any associated chest pain or shortness of breath.  The history is provided by the patient.  Abdominal Pain      Home Medications Prior to Admission medications   Not on File      Allergies    Patient has no known allergies.    Review of Systems   Review of Systems  Gastrointestinal:  Positive for abdominal pain.    Physical Exam Updated Vital Signs BP 116/78   Pulse 73   Temp 98.3 F (36.8 C) (Oral)   Resp 20   Ht 5\' 11"  (1.803 m)   Wt 75.3 kg   SpO2 99%   BMI 23.15 kg/m  Physical Exam Vitals and nursing note reviewed.  Constitutional:      General: He is not in acute distress.    Appearance: He is well-developed.  HENT:     Head: Normocephalic and atraumatic.     Mouth/Throat:     Mouth: Mucous membranes are moist.     Pharynx: Oropharynx is clear.  Eyes:     Extraocular Movements: Extraocular movements intact.  Cardiovascular:     Rate and Rhythm: Normal rate and regular rhythm.     Heart sounds: Normal heart  sounds.  Pulmonary:     Effort: Pulmonary effort is normal.     Breath sounds: Normal breath sounds.  Abdominal:     General: Abdomen is flat.     Palpations: Abdomen is soft.     Tenderness: There is no abdominal tenderness. There is no guarding or rebound.  Skin:    General: Skin is warm and dry.  Neurological:     General: No focal deficit present.     Mental Status: He is alert and oriented to person, place, and time.  Psychiatric:        Mood and Affect: Mood normal.        Behavior: Behavior normal.     ED Results / Procedures / Treatments   Labs (all labs ordered are listed, but only abnormal results are displayed) Labs Reviewed  CBC WITH DIFFERENTIAL/PLATELET  COMPREHENSIVE METABOLIC PANEL  CK    EKG None  Radiology No results found.  Procedures Procedures    Medications Ordered in ED Medications  sodium chloride 0.9 % bolus 1,000 mL (has no administration in time range)  ondansetron (ZOFRAN) injection 4 mg (has no administration in time range)  acetaminophen (TYLENOL) tablet 650 mg (has no administration in time range)  ED Course/ Medical Decision Making/ A&P Clinical Course as of 12/12/21 0109  Fri Dec 12, 2021  0920 Labs reviewed and interpreted by myself show mildly elevated CK and consistent with rhabdo.  He is mildly hypokalemic with a potassium of 3.1 and will be repleted with p.o. potassium.  His creatinine is mildly elevated with unknown baseline. [VK]  [id number] Upon reassessment, the patient reports improvement of his symptoms.  He has been able to tolerate p.o.  Patient is stable for discharge home and will be given a short course of potassium supplementation and primary care follow-up for repeat labs.  He was given strict return precautions. [VK]    Clinical Course User Index [VK] Phoebe Sharps, DO                           Medical Decision Making This patient presents to the ED with chief complaint(s) of nausea/vomiting/abdominal pain  with no pertinent past medical history which further complicates the presenting complaint. The complaint involves an extensive differential diagnosis and also carries with it a high risk of complications and morbidity.    The differential diagnosis includes arrhythmia, dehydration, electrolyte abnormality, rhabdo, heat exhaustion, heat cramps, patient well appearing with normal mental status and normal temperature making heat stroke unlikely, patient has no point abdominal tenderness and no fevers making intra-abdominal infection unlikely, the patient has no urinary symptoms making UTI unlikely  Additional history obtained: Additional history obtained from N/A Records reviewed N/A  ED Course and Reassessment: Patient is overall well-appearing no acute distress and does not appear severely dehydrated on exam, however he will have symptomatic treatment with IV fluids, Zofran and Tylenol and he will have labs to assess for signs of dehydration, electrolyte abnormality or rhabdo as causes of his symptoms.  EKG was performed to evaluate for arrhythmia or signs of electrolyte abnormality.  He will be closely monitored and reassessed.  Independent labs interpretation:  The following labs were independently interpreted: Hypokalemia, mildly elevated creatinine, last creatinine in system 1.19, 1.27 today.  CK mildly elevated but not within range of rhabdo  Independent visualization of imaging: N/A  Consultation: - Consulted or discussed management/test interpretation w/ external professional: N/A  Consideration for admission or further workup: Patient has no conditions requiring emergent admission or further work-up.  He is stable for discharge with outpatient primary care follow-up for repeat labs Social Determinants of health: N/A    Amount and/or Complexity of Data Reviewed Labs: ordered.  Risk OTC drugs. Prescription drug management.           Final Clinical Impression(s) / ED  Diagnoses Final diagnoses:  None    Rx / DC Orders ED Discharge Orders     None         Phoebe Sharps, DO 12/12/21 [phone]

## 2021-12-12 NOTE — Discharge Instructions (Addendum)
You were seen in the emergency department for your nausea, vomiting and cramping.  This is likely due to dehydration from working in the heat.  You had a mild kidney injury and we gave you IV fluids to help rehydrate you.  Your potassium level is also low likely due to your sweating, vomiting and diarrhea.  We repleted you with oral potassium and I gave you a short course of potassium supplement you should take for the next week.  You should follow-up in the primary care clinic within the next week to have your labs rechecked to make sure that your kidney function and potassium level have normalized.  You should return to the emergency department if you develop chest pain, you pass out, you have repetitive vomiting despite the nausea medicine, you have worsening abdominal pain, you have fevers or if you have any other new or concerning symptoms.

## 2021-12-12 NOTE — ED Notes (Signed)
Pt discharged to home. Discharge instructions have been discussed with patient and/or family members. Pt verbally acknowledges understanding d/c instructions, and endorses comprehension to checkout at registration before leaving.  °

## 2021-12-12 NOTE — ED Notes (Signed)
Nursing assessment: states since yesterday has had body aches and cramping with fatigue, has had some N/V/D as well. 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

## 2021-12-12 NOTE — ED Triage Notes (Signed)
Pt arrives pov, steady gait, c/o abd pain and cramping, n/v/d and feeling light headed. Pt AOx4. Pt works outside Aeronautical engineer.

## 2022-02-16 ENCOUNTER — Emergency Department (HOSPITAL_COMMUNITY)
Admission: EM | Admit: 2022-02-16 | Discharge: 2022-02-16 | Discharge status: Against Medical Advice | Payer: Self-pay | Source: Home / Self Care

## 2022-08-23 ENCOUNTER — Telehealth: Payer: Medicaid Other | Admitting: Nurse Practitioner

## 2023-01-29 ENCOUNTER — Telehealth: Payer: Medicaid Other | Admitting: Emergency Medicine

## 2023-01-29 DIAGNOSIS — S39012A Strain of muscle, fascia and tendon of lower back, initial encounter: Secondary | ICD-10-CM

## 2023-01-29 MED ORDER — CYCLOBENZAPRINE HCL 5 MG PO TABS: 5.0000 mg | ORAL_TABLET | Freq: Three times a day (TID) | ORAL | 0 refills | Status: AC | PRN

## 2023-01-29 MED ORDER — IBUPROFEN 600 MG PO TABS: 600.0000 mg | ORAL_TABLET | Freq: Three times a day (TID) | ORAL | 0 refills | Status: AC | PRN

## 2023-01-29 NOTE — Patient Instructions (Signed)
  Rodney [name], thank you for joining Rodney Parsons, NP for today's virtual visit.  While this provider is not your primary [name] provider (PCP), if your PCP is located in our provider database this encounter information will be shared with them immediately following your visit.   A [name] account gives you access to today's visit and all your visits, tests, and labs performed at [name] " click here if you don't have a [name] account or go to [name]  Consent: (Patient) Rodney [name] provided verbal consent for this virtual visit at the beginning of the encounter.  Current Medications:  Current Outpatient Medications:    cyclobenzaprine (FLEXERIL) 5 MG tablet, Take 1 tablet (5 mg total) by mouth 3 (three) times daily as needed for muscle spasms., Disp: 21 tablet, Rfl: 0   ibuprofen (ADVIL) 600 MG tablet, Take 1 tablet (600 mg total) by mouth every 8 (eight) hours as needed., Disp: 30 tablet, Rfl: 0   Medications ordered in this encounter:  Meds ordered this encounter  Medications   cyclobenzaprine (FLEXERIL) 5 MG tablet    Sig: Take 1 tablet (5 mg total) by mouth 3 (three) times daily as needed for muscle spasms.    Dispense:  21 tablet    Refill:  0   ibuprofen (ADVIL) 600 MG tablet    Sig: Take 1 tablet (600 mg total) by mouth every 8 (eight) hours as needed.    Dispense:  30 tablet    Refill:  0     *If you need refills on other medications prior to your next appointment, please contact your pharmacy*  Follow-Up: Call back or seek an in-person evaluation if the symptoms worsen or if the condition fails to improve as anticipated.  [name] [phone]  Other Instructions Ok to use ice therapy for the first 48 hours after injury, then switch to heat therapy. Whether using hot or cold therapy, make sure you don't put it on and leave it on; make sure you have time on with the therapy and time off. For  example, apply an ice pack for 15-20 minutes at a time and then you can use it again later on.   The cyclobenzaprine (muscle relaxer) can make you sleepy, so if you need to drive or work, just take it at bedtime. If you are home resting, you can take it up to 3 times a day.    If you have been instructed to have an in-person evaluation today at a local Urgent [name] facility, please use the link below. It will take you to a list of all of our available [name], including address, phone number and hours of operation. Please do not delay [name].  [name]  If you or a family member do not have a primary [name] provider, use the link below to schedule a visit and establish [name]. When you choose a [name] primary [name] physician or advanced practice provider, you gain a long-term partner in health. Find a Primary [name] Provider  Learn more about [facility] [name]'s in-office and virtual [name] options: [place] - Get [name] Now

## 2023-01-29 NOTE — Progress Notes (Signed)
Virtual Visit Consent   Rodney [name], you are scheduled for a virtual visit with a [facility] provider today. Just as with appointments in the office, your consent must be obtained to participate. Your consent will be active for this visit and any virtual visit you may have with one of our providers in the next 365 days. If you have a MyChart account, a copy of this consent can be sent to you electronically.  As this is a virtual visit, video technology does not allow for your provider to perform a traditional examination. This may limit your provider's ability to fully assess your condition. If your provider identifies any concerns that need to be evaluated in person or the need to arrange testing (such as labs, EKG, etc.), we will make arrangements to do so. Although advances in technology are sophisticated, we cannot ensure that it will always work on either your end or our end. If the connection with a video visit is poor, the visit may have to be switched to a telephone visit. With either a video or telephone visit, we are not always able to ensure that we have a secure connection.  By engaging in this virtual visit, you consent to the provision of healthcare and authorize for your insurance to be billed (if applicable) for the services provided during this visit. Depending on your insurance coverage, you may receive a charge related to this service.  I need to obtain your verbal consent now. Are you willing to proceed with your visit today? Rodney [name] has provided verbal consent on 01/29/2023 for a virtual visit (video or telephone). [name], NP  Date: 01/29/2023 4:45 PM  Virtual Visit via Video Note   I, [name], connected with  Rodney [name]  ([id number], 19-May-1970) on 01/29/23 at  4:45 PM EDT by a video-enabled telemedicine application and verified that I am speaking with the correct person using two identifiers.  Location: Patient: Virtual Visit Location Patient:  Home Provider: Virtual Visit Location Provider: Home Office   I discussed the limitations of evaluation and management by telemedicine and the availability of in person appointments. The patient expressed understanding and agreed to proceed.    History of Present Illness: Rodney [name] is a 52 y.o. who identifies as a male who was assigned male at birth, and is being seen today for pulled muscle in back. Was changing a tire today when he felt a muscle pull in in R lower back. Radiates down the back of his leg to his thigh. Denies numbness, tingling, or weakness. Denies pain or sx on L side. Can walk wihtout issue. No changes in bowel or bladder habits. Took ibuprofen today and soaked in a tub with epsom salt  HPI: HPI  Problems: There are no problems to display for this patient.   Allergies: No Known Allergies Medications:  Current Outpatient Medications:    cyclobenzaprine (FLEXERIL) 5 MG tablet, Take 1 tablet (5 mg total) by mouth 3 (three) times daily as needed for muscle spasms., Disp: 21 tablet, Rfl: 0   ibuprofen (ADVIL) 600 MG tablet, Take 1 tablet (600 mg total) by mouth every 8 (eight) hours as needed., Disp: 30 tablet, Rfl: 0  Observations/Objective: Patient is well-developed, well-nourished in no acute distress.  Resting comfortably  at home.  Head is normocephalic, atraumatic.  No labored breathing.  Speech is clear and coherent with logical content.  Patient is alert and oriented at baseline.    Assessment and  Plan: 1. Strain of lumbar region, initial encounter  No red flags, most  likely strained muscle. Reviewed supportive care measures, timing to heal  Follow Up Instructions: I discussed the assessment and treatment plan with the patient. The patient was provided an opportunity to ask questions and all were answered. The patient agreed with the plan and demonstrated an understanding of the instructions.  A copy of instructions were sent to the patient via MyChart  unless otherwise noted below.   The patient was advised to call back or seek an in-person evaluation if the symptoms worsen or if the condition fails to improve as anticipated.    [name], NP

## 2023-02-14 ENCOUNTER — Telehealth: Payer: Medicaid Other | Admitting: Nurse Practitioner

## 2023-02-14 DIAGNOSIS — Z7251 High risk heterosexual behavior: Secondary | ICD-10-CM | POA: Diagnosis not present

## 2023-02-14 NOTE — Patient Instructions (Signed)
  Rodney [name], thank you for joining Claiborne Rigg, NP for today's virtual visit.  While this provider is not your primary care provider (PCP), if your PCP is located in our provider database this encounter information will be shared with them immediately following your visit.   A [facility] MyChart account gives you access to today's visit and all your visits, tests, and labs performed at [facility] " click here if you don't have a [facility] MyChart account or go to [email or web address]  Consent: (Patient) Rodney [name] provided verbal consent for this virtual visit at the beginning of the encounter.  Current Medications:  Current Outpatient Medications:    cyclobenzaprine (FLEXERIL) 5 MG tablet, Take 1 tablet (5 mg total) by mouth 3 (three) times daily as needed for muscle spasms., Disp: 21 tablet, Rfl: 0   ibuprofen (ADVIL) 600 MG tablet, Take 1 tablet (600 mg total) by mouth every 8 (eight) hours as needed., Disp: 30 tablet, Rfl: 0   Medications ordered in this encounter:  No orders of the defined types were placed in this encounter.    *If you need refills on other medications prior to your next appointment, please contact your pharmacy*  Follow-Up: Call back or seek an in-person evaluation if the symptoms worsen or if the condition fails to improve as anticipated.  [facility] [phone]  Other Instructions Would need to be formally tested in urgent care or health department   If you have been instructed to have an in-person evaluation today at a local Urgent Care facility, please use the link below. It will take you to a list of all of our available [facility], including address, phone number and hours of operation. Please do not delay care.  [facility]  If you or a family member do not have a primary care provider, use the link below to schedule a visit and establish care. When you choose a [facility]  primary care physician or advanced practice provider, you gain a long-term partner in health. Find a Primary Care Provider  Learn more about [facility]'s in-office and virtual care options:  - Get Care Now

## 2023-02-14 NOTE — Progress Notes (Signed)
Virtual Visit Consent   Rodney [name], you are scheduled for a virtual visit with a [facility] provider today. Just as with appointments in the office, your consent must be obtained to participate. Your consent will be active for this visit and any virtual visit you may have with one of our providers in the next 365 days. If you have a MyChart account, a copy of this consent can be sent to you electronically.  As this is a virtual visit, video technology does not allow for your provider to perform a traditional examination. This may limit your provider's ability to fully assess your condition. If your provider identifies any concerns that need to be evaluated in person or the need to arrange testing (such as labs, EKG, etc.), we will make arrangements to do so. Although advances in technology are sophisticated, we cannot ensure that it will always work on either your end or our end. If the connection with a video visit is poor, the visit may have to be switched to a telephone visit. With either a video or telephone visit, we are not always able to ensure that we have a secure connection.  By engaging in this virtual visit, you consent to the provision of healthcare and authorize for your insurance to be billed (if applicable) for the services provided during this visit. Depending on your insurance coverage, you may receive a charge related to this service.  I need to obtain your verbal consent now. Are you willing to proceed with your visit today? [name] has provided verbal consent on 02/14/2023 for a virtual visit (video or telephone). Claiborne Rigg, NP  Date: 02/14/2023 10:46 AM  Virtual Visit via Video Note   I, Claiborne Rigg, connected with  Rodney [name]  ([id number], Oct 06, 1970) on 02/14/23 at 10:45 AM EDT by a video-enabled telemedicine application and verified that I am speaking with the correct person using two identifiers.  Location: Patient: Virtual Visit Location Patient:  Home Provider: Virtual Visit Location Provider: Home Office   I discussed the limitations of evaluation and management by telemedicine and the availability of in person appointments. The patient expressed understanding and agreed to proceed.    History of Present Illness: Rodney [name] is a 52 y.o. who identifies as a male who was assigned male at birth, and is being seen today for possible STD exposure.  [name] states he had unprotected sex recently and is unsure if he has a UTI or STD. Currently his only symptom is discomfort in his abdomen and penis. He does not have a history of STD    Problems: There are no problems to display for this patient.   Allergies: No Known Allergies Medications:  Current Outpatient Medications:    cyclobenzaprine (FLEXERIL) 5 MG tablet, Take 1 tablet (5 mg total) by mouth 3 (three) times daily as needed for muscle spasms., Disp: 21 tablet, Rfl: 0   ibuprofen (ADVIL) 600 MG tablet, Take 1 tablet (600 mg total) by mouth every 8 (eight) hours as needed., Disp: 30 tablet, Rfl: 0  Observations/Objective: Patient is well-developed, well-nourished in no acute distress.  Resting comfortably at home.  Head is normocephalic, atraumatic.  No labored breathing.  Speech is clear and coherent with logical content.  Patient is alert and oriented at baseline.    Assessment and Plan: 1. High risk heterosexual behavior Would need to be formally tested in urgent care or health department  Follow Up Instructions: I discussed the assessment  and treatment plan with the patient. The patient was provided an opportunity to ask questions and all were answered. The patient agreed with the plan and demonstrated an understanding of the instructions.  A copy of instructions were sent to the patient via MyChart unless otherwise noted below.    The patient was advised to call back or seek an in-person evaluation if the symptoms worsen or if the condition fails to improve as  anticipated.    Claiborne Rigg, NP

## 2023-03-03 ENCOUNTER — Other Ambulatory Visit (HOSPITAL_BASED_OUTPATIENT_CLINIC_OR_DEPARTMENT_OTHER): Payer: Self-pay

## 2023-05-06 ENCOUNTER — Encounter (HOSPITAL_BASED_OUTPATIENT_CLINIC_OR_DEPARTMENT_OTHER): Payer: Self-pay | Admitting: Emergency Medicine

## 2023-05-06 ENCOUNTER — Other Ambulatory Visit (HOSPITAL_BASED_OUTPATIENT_CLINIC_OR_DEPARTMENT_OTHER): Payer: Self-pay

## 2023-05-06 ENCOUNTER — Emergency Department (HOSPITAL_BASED_OUTPATIENT_CLINIC_OR_DEPARTMENT_OTHER)
Admission: EM | Admit: 2023-05-06 | Discharge: 2023-05-06 | Disposition: A | Discharge status: Home / Self Care | Payer: Medicaid Other

## 2023-05-06 ENCOUNTER — Other Ambulatory Visit: Payer: Self-pay

## 2023-05-06 DIAGNOSIS — R5383 Other fatigue: Secondary | ICD-10-CM | POA: Insufficient documentation

## 2023-05-06 DIAGNOSIS — R300 Dysuria: Secondary | ICD-10-CM | POA: Insufficient documentation

## 2023-05-06 DIAGNOSIS — K0889 Other specified disorders of teeth and supporting structures: Secondary | ICD-10-CM | POA: Diagnosis present

## 2023-05-06 DIAGNOSIS — Z202 Contact with and (suspected) exposure to infections with a predominantly sexual mode of transmission: Secondary | ICD-10-CM

## 2023-05-06 LAB — CBC
HCT: 50 % (ref 39.0–52.0)
Hemoglobin: 16.6 g/dL (ref 13.0–17.0)
MCH: 31.3 pg (ref 26.0–34.0)
MCHC: 33.2 g/dL (ref 30.0–36.0)
MCV: 94.3 fL (ref 80.0–100.0)
Platelets: 152 10*3/uL (ref 150–400)
RBC: 5.3 MIL/uL (ref 4.22–5.81)
RDW: 12.9 % (ref 11.5–15.5)
WBC: 7.6 10*3/uL (ref 4.0–10.5)
nRBC: 0 % (ref 0.0–0.2)

## 2023-05-06 LAB — BASIC METABOLIC PANEL
Anion gap: 6 (ref 5–15)
BUN: 7 mg/dL (ref 6–20)
CO2: 28 mmol/L (ref 22–32)
Calcium: 8.8 mg/dL — ABNORMAL LOW (ref 8.9–10.3)
Chloride: 108 mmol/L (ref 98–111)
Creatinine, Ser: 1.07 mg/dL (ref 0.61–1.24)
GFR, Estimated: >60 mL/min (ref >60)
Glucose, Bld: 100 mg/dL — ABNORMAL HIGH (ref 70–99)
Potassium: 4.1 mmol/L (ref 3.5–5.1)
Sodium: 142 mmol/L (ref 135–145)

## 2023-05-06 LAB — URINALYSIS, ROUTINE W REFLEX MICROSCOPIC
Bilirubin Urine: NEGATIVE
Glucose, UA: NEGATIVE mg/dL
Hgb urine dipstick: NEGATIVE
Ketones, ur: NEGATIVE mg/dL
Leukocytes,Ua: NEGATIVE
Nitrite: NEGATIVE
Protein, ur: NEGATIVE mg/dL
Specific Gravity, Urine: <=1.005 (ref 1.005–1.030)
pH: 6 (ref 5.0–8.0)

## 2023-05-06 LAB — HIV ANTIBODY (ROUTINE TESTING W REFLEX): HIV Screen 4th Generation wRfx: NONREACTIVE

## 2023-05-06 MED ORDER — DOXYCYCLINE HYCLATE 100 MG PO CAPS
100.0000 mg | ORAL_CAPSULE | Freq: Two times a day (BID) | ORAL | 0 refills | Status: AC
  Filled 2023-05-06: qty 14, 7d supply, fill #0

## 2023-05-06 MED ORDER — CEFTRIAXONE SODIUM 500 MG IJ SOLR
500.0000 mg | Freq: Once | INTRAMUSCULAR | Status: AC
  Administered 2023-05-06: 500 mg via INTRAMUSCULAR
  Filled 2023-05-06: qty 500

## 2023-05-06 MED ORDER — DOXYCYCLINE HYCLATE 100 MG PO CAPS: 100.0000 mg | ORAL_CAPSULE | Freq: Two times a day (BID) | ORAL | 0 refills | Status: DC

## 2023-05-06 MED ORDER — DOXYCYCLINE HYCLATE 100 MG PO TABS
100.0000 mg | ORAL_TABLET | Freq: Once | ORAL | Status: AC
  Administered 2023-05-06: 100 mg via ORAL
  Filled 2023-05-06: qty 1

## 2023-05-06 NOTE — ED Provider Notes (Signed)
[place]GENC[facility] DE[facility] AT [facility] [place] Provider Note   CSN: [id number] Arrival date & time: 05/06/23  2841     History  No chief complaint on file.   Rodney [name] is a 53 y.o. male.  53 year old male with no reported past medical history presenting to the emergency department today complaining of some fatigue as well as some dysuria.  The patient states that he had a new sexual partner a few weeks ago and since then he has had some fatigue as well as some dysuria.  He denies any discharge.  Denies any testicular pain or swelling.  He states that he also started donating plasma and was wondering if this could be causing some of the fatigue.  He also reports some chronic dental pain.  He states is being on now for the past few months.  He does have an appointment but this is not for a while so he wanted to get this checked out as well.  He states that he does have some cavities that need to be taken care of.  He denies any fevers.        Home Medications Prior to Admission medications   Medication Sig Start Date End Date Taking? Authorizing Provider  doxycycline (VIBRAMYCIN) 100 MG capsule Take 1 capsule (100 mg total) by mouth 2 (two) times daily. 05/06/23  Yes Durwin Glaze, MD  cyclobenzaprine (FLEXERIL) 5 MG tablet Take 1 tablet (5 mg total) by mouth 3 (three) times daily as needed for muscle spasms. 01/29/23   [name], NP  ibuprofen (ADVIL) 600 MG tablet Take 1 tablet (600 mg total) by mouth every 8 (eight) hours as needed. 01/29/23   [name], NP      Allergies    Patient has no known allergies.    Review of Systems   Review of Systems  Constitutional:  Positive for fatigue.  HENT:  Positive for dental problem.   Genitourinary:  Positive for dysuria.  All other systems reviewed and are negative.   Physical Exam Updated Vital Signs BP 122/70 (BP Location: Right Arm)   Pulse 71   Temp 97.9 F (36.6 C) (Oral)   Resp 16   Ht 6' (1.829  m)   Wt 73 kg   SpO2 100%   BMI 21.84 kg/m  Physical Exam Vitals and nursing note reviewed.   Gen: NAD Eyes: PERRL, EOMI HEENT: no oropharyngeal swelling, multiple dental caries noted particularly in the back upper molars with no significant gingival erythema or swelling noted Neck: trachea midline Resp: clear to auscultation bilaterally Card: RRR, no murmurs, rubs, or gallops Abd: nontender, nondistended GU: Normal-appearing external genitalia, no epididymal or testicular tenderness noted, no discharge noted Extremities: no calf tenderness, no edema Vascular: 2+ radial pulses bilaterally, 2+ DP pulses bilaterally Skin: no rashes Psyc: acting appropriately   ED Results / Procedures / Treatments   Labs (all labs ordered are listed, but only abnormal results are displayed) Labs Reviewed  BASIC METABOLIC PANEL - Abnormal; Notable for the following components:      Result Value   Glucose, Bld 100 (*)    Calcium 8.8 (*)    All other components within normal limits  CBC  URINALYSIS, ROUTINE W REFLEX MICROSCOPIC  HIV ANTIBODY (ROUTINE TESTING W REFLEX)  GC/CHLAMYDIA PROBE AMP ([place]) NOT AT [facility]    EKG None  Radiology No results found.  Procedures Procedures    Medications Ordered in ED Medications  cefTRIAXone (ROCEPHIN)  injection 500 mg (500 mg Intramuscular Given 05/06/23 0904)  doxycycline (VIBRA-TABS) tablet 100 mg (100 mg Oral Given 05/06/23 8295)    ED Course/ Medical Decision Making/ A&P                                 Medical Decision Making 53 year old male with no reported past medical history presenting to the emergency department today with fatigue and dysuria.  I will further evaluate the patient here with basic labs as well as a urine gonorrhea/chlamydia.  Will also obtain HIV testing here.  Basic labs will evaluate for anemia or electrolyte abnormalities.  The patient's dentition is poor but he does not have any significant swelling.  Suspicion  for abscess or deep space soft tissue infection is low at this time.  Still seem to be more of a chronic issue.  I will give the patient IM Rocephin here to treat empirically for gonorrhea and chlamydia as well as doxycycline.  This should also cover for periodontal disease as well.  He will likely be discharged.  The patient's labs here are reassuring.  He is discharged with return precautions.  Amount and/or Complexity of Data Reviewed Labs: ordered.  Risk Prescription drug management.           Final Clinical Impression(s) / ED Diagnoses Final diagnoses:  Pain, dental  Possible exposure to STD    Rx / DC Orders ED Discharge Orders          Ordered    doxycycline (VIBRAMYCIN) 100 MG capsule  2 times daily        05/06/23 0908              Durwin Glaze, MD 05/06/23 [phone]

## 2023-05-06 NOTE — ED Triage Notes (Addendum)
Started  to feel bad 4 days ago  states after  sexual   contactthrobbing in penis and a bad left upper tooth pain

## 2023-05-06 NOTE — Discharge Instructions (Signed)
Your labs are reassuring.  Please take the doxycycline as prescribed.  Follow-up with your doctor.  Return to the ER for worsening symptoms.

## 2023-05-07 LAB — GC/CHLAMYDIA PROBE AMP (~~LOC~~) NOT AT ARMC
Chlamydia: NEGATIVE
Comment: NEGATIVE
Comment: NORMAL
Neisseria Gonorrhea: NEGATIVE

## 2023-06-06 ENCOUNTER — Emergency Department (HOSPITAL_BASED_OUTPATIENT_CLINIC_OR_DEPARTMENT_OTHER)
Admission: EM | Admit: 2023-06-06 | Discharge: 2023-06-06 | Disposition: A | Discharge status: Home / Self Care | Payer: Medicaid Other | Attending: Emergency Medicine | Admitting: Emergency Medicine

## 2023-06-06 ENCOUNTER — Other Ambulatory Visit: Payer: Self-pay

## 2023-06-06 ENCOUNTER — Encounter (HOSPITAL_BASED_OUTPATIENT_CLINIC_OR_DEPARTMENT_OTHER): Payer: Self-pay | Admitting: Urology

## 2023-06-06 DIAGNOSIS — Z202 Contact with and (suspected) exposure to infections with a predominantly sexual mode of transmission: Secondary | ICD-10-CM | POA: Diagnosis present

## 2023-06-06 LAB — URINALYSIS, ROUTINE W REFLEX MICROSCOPIC
Bilirubin Urine: NEGATIVE
Glucose, UA: NEGATIVE mg/dL
Hgb urine dipstick: NEGATIVE
Ketones, ur: NEGATIVE mg/dL
Leukocytes,Ua: NEGATIVE
Nitrite: NEGATIVE
Protein, ur: NEGATIVE mg/dL
Specific Gravity, Urine: 1.01 (ref 1.005–1.030)
pH: 6 (ref 5.0–8.0)

## 2023-06-06 LAB — WET PREP, GENITAL
Clue Cells Wet Prep HPF POC: NONE SEEN
Sperm: NONE SEEN
Trich, Wet Prep: NONE SEEN
WBC, Wet Prep HPF POC: <10 (ref <10)
Yeast Wet Prep HPF POC: NONE SEEN

## 2023-06-06 MED ORDER — METRONIDAZOLE 500 MG PO TABS
2000.0000 mg | ORAL_TABLET | Freq: Once | ORAL | Status: AC
  Administered 2023-06-06: 2000 mg via ORAL
  Filled 2023-06-06: qty 4

## 2023-06-06 MED ORDER — AZITHROMYCIN 250 MG PO TABS
1000.0000 mg | ORAL_TABLET | Freq: Once | ORAL | Status: AC
  Administered 2023-06-06: 1000 mg via ORAL
  Filled 2023-06-06: qty 4

## 2023-06-06 MED ORDER — CEFTRIAXONE SODIUM 500 MG IJ SOLR
500.0000 mg | Freq: Once | INTRAMUSCULAR | Status: AC
  Administered 2023-06-06: 500 mg via INTRAMUSCULAR
  Filled 2023-06-06: qty 500

## 2023-06-06 MED ORDER — LIDOCAINE HCL (PF) 1 % IJ SOLN
1.0000 mL | Freq: Once | INTRAMUSCULAR | Status: AC
  Administered 2023-06-06: 1 mL
  Filled 2023-06-06: qty 5

## 2023-06-06 NOTE — ED Provider Notes (Signed)
 [place] [facility] Provider Note   CSN: [id number] Arrival date & time: 06/06/23  1137     History  Chief Complaint  Patient presents with   Penile Concern    Rodney [name] is a 53 y.o. male who presents with penile discomfort over the past few days.  Patient was evaluated on 1/16 for similar complaints that he and his wife both had.  He was treated with ceftriaxone and Doxy empirically.  His urine and STI panel came back negative.  However he reports that he had significant improvement of his symptoms.  His wife he states however only received a antifungal cream and did not receive antibiotics.  States he waited 2 weeks to have intercourse again but went he did 2 days ago he reports recurrence of symptoms.  Denies any discharge or scrotal pain.  No fevers or chills.  No dysuria.  HPI     Home Medications Prior to Admission medications   Medication Sig Start Date End Date Taking? Authorizing Provider  cyclobenzaprine (FLEXERIL) 5 MG tablet Take 1 tablet (5 mg total) by mouth 3 (three) times daily as needed for muscle spasms. 01/29/23   [name], NP  doxycycline (VIBRAMYCIN) 100 MG capsule Take 1 capsule (100 mg total) by mouth 2 (two) times daily. 05/06/23   Durwin Glaze, MD  ibuprofen (ADVIL) 600 MG tablet Take 1 tablet (600 mg total) by mouth every 8 (eight) hours as needed. 01/29/23   [name], NP      Allergies    Patient has no known allergies.    Review of Systems   Review of Systems  Physical Exam Updated Vital Signs BP 117/73 (BP Location: Left Arm)   Pulse 84   Temp (!) 97.4 F (36.3 C)   Resp 18   Ht 6' (1.829 m)   Wt 73 kg   SpO2 100%   BMI 21.83 kg/m  Physical Exam Vitals and nursing note reviewed. Exam conducted with a chaperone present.  Constitutional:      General: He is not in acute distress.    Appearance: He is well-developed.  HENT:     Head: Normocephalic and atraumatic.  Eyes:      Conjunctiva/sclera: Conjunctivae normal.  Pulmonary:     Effort: Pulmonary effort is normal. No respiratory distress.  Abdominal:     Palpations: Abdomen is soft.     Tenderness: There is no abdominal tenderness.  Genitourinary:    Comments: Scrotum is without erythema, warmth, tenderness or swelling.  There is no penile discharge, ulcers or wounds.  No lymphadenopathy Musculoskeletal:     Cervical back: Neck supple.  Skin:    General: Skin is warm and dry.     Capillary Refill: Capillary refill takes less than 2 seconds.  Neurological:     Mental Status: He is alert.  Psychiatric:        Mood and Affect: Mood normal.     ED Results / Procedures / Treatments   Labs (all labs ordered are listed, but only abnormal results are displayed) Labs Reviewed  WET PREP, GENITAL  URINALYSIS, ROUTINE W REFLEX MICROSCOPIC  GC/CHLAMYDIA PROBE AMP ([place]) NOT AT [facility]    EKG None  Radiology No results found.  Procedures Procedures    Medications Ordered in ED Medications  azithromycin (ZITHROMAX) tablet 1,000 mg (1,000 mg Oral Given 06/06/23 1227)  cefTRIAXone (ROCEPHIN) injection 500 mg (500 mg Intramuscular Given 06/06/23 1230)  lidocaine (  PF) (XYLOCAINE) 1 % injection 1-2.1 mL (1 mL Other Given 06/06/23 1229)  metroNIDAZOLE (FLAGYL) tablet 2,000 mg (2,000 mg Oral Given 06/06/23 1227)    ED Course/ Medical Decision Making/ A&P                                 Medical Decision Making  This patient presents to the ED with chief complaint(s) of STI concern.  The complaint involves an extensive differential diagnosis and also carries with it a high risk of complications and morbidity.   pertinent past medical history as listed in HPI  The differential diagnosis includes  STI, cystitis, pyelonephritis, testicular torsion, epididymitis, orchitis The initial plan is to  Will start with repeat UA and STD testing and empiric treatment Additional history obtained: Records reviewed  previous admission documents and Care Everywhere/External Records  Initial Assessment:   Hemodynamically stable, afebrile, nontoxic-appearing patient presenting with penile discomfort and concern for recurrent STI.  Testing last month was negative, however he had significant improvement with empiric antibiotic coverage.  He is is concerned his partner was not adequately treated.  He would like repeat testing and empiric treatment today.  His abdomen is nontender.  Scrotum is without any pain or swelling.  No concern for testicular torsion or epididymitis.  Independent ECG interpretation:  None  Independent labs interpretation:  The following labs were independently interpreted:  UA and wet prep unremarkable, GC pending  Independent visualization and interpretation of imaging: none  Treatment and Reassessment: Patient given empiric treatment including Rocephin, azithromycin and Flagyl  Consultations obtained:   none  Disposition:   Patient will be discharged home. The patient has been appropriately medically screened and/or stabilized in the ED. I have low suspicion for any other emergent medical condition which would require further screening, evaluation or treatment in the ED or require inpatient management. At time of discharge the patient is hemodynamically stable and in no acute distress. I have discussed work-up results and diagnosis with patient and answered all questions. Patient is agreeable with discharge plan. We discussed strict return precautions for returning to the emergency department and they verbalized understanding.     Social Determinants of Health:   none  This note was dictated with voice recognition software.  Despite best efforts at proofreading, errors may have occurred which can change the documentation meaning.          Final Clinical Impression(s) / ED Diagnoses Final diagnoses:  Possible exposure to STI    Rx / DC Orders ED Discharge Orders      None         Halford Decamp, PA-C 06/06/23 1244    Sloan Leiter, DO 06/07/23 [phone]

## 2023-06-06 NOTE — ED Triage Notes (Signed)
 Pt states was exposed to STD unknown  Was seen here 1/16 was tested and was treated and states "still has funny feeling down there".

## 2023-06-06 NOTE — Discharge Instructions (Addendum)
 You were evaluated in the emergency room for STI concern.  Your urine did not show any significant abnormality.  Your testing for chlamydia and gonorrhea is still pending.  You were given empiric antibiotics while here.  I would recommend your partner get repeat testing.  If you experience any new or worsening symptoms please return to the emergency room.

## 2023-06-07 LAB — GC/CHLAMYDIA PROBE AMP (~~LOC~~) NOT AT ARMC
Chlamydia: NEGATIVE
Comment: NEGATIVE
Comment: NORMAL
Neisseria Gonorrhea: NEGATIVE

## 2023-06-22 ENCOUNTER — Ambulatory Visit
